# Patient Record
Sex: Female | Born: 1990 | Hispanic: No | Marital: Single | Smoking: Current every day smoker
Health system: Southern US, Community
[De-identification: ages and names within clinical notes are randomized; demographics above are authoritative.]

## PROBLEM LIST (undated history)

## (undated) ENCOUNTER — Emergency Department: Discharge status: Absent without leave | Payer: BLUE CROSS/BLUE SHIELD

## (undated) DIAGNOSIS — F319 Bipolar disorder, unspecified: Secondary | ICD-10-CM

## (undated) DIAGNOSIS — F429 Obsessive-compulsive disorder, unspecified: Secondary | ICD-10-CM

## (undated) DIAGNOSIS — F329 Major depressive disorder, single episode, unspecified: Secondary | ICD-10-CM

## (undated) DIAGNOSIS — F32A Depression, unspecified: Secondary | ICD-10-CM

## (undated) DIAGNOSIS — F419 Anxiety disorder, unspecified: Secondary | ICD-10-CM

## (undated) HISTORY — PX: SPINE SURGERY: SHX786

## (undated) HISTORY — PX: FRACTURE SURGERY: SHX138

---
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
## 2009-02-22 ENCOUNTER — Emergency Department: Payer: Self-pay | Admitting: Emergency Medicine

## 2009-02-25 ENCOUNTER — Emergency Department: Payer: Self-pay | Admitting: Internal Medicine

## 2011-11-08 ENCOUNTER — Emergency Department: Payer: Self-pay | Admitting: Emergency Medicine

## 2012-03-11 ENCOUNTER — Emergency Department: Payer: Self-pay | Admitting: Internal Medicine

## 2012-11-06 ENCOUNTER — Emergency Department: Payer: Self-pay | Admitting: Emergency Medicine

## 2012-11-10 ENCOUNTER — Emergency Department: Payer: Self-pay | Admitting: Emergency Medicine

## 2013-07-14 ENCOUNTER — Emergency Department: Payer: Self-pay | Admitting: Emergency Medicine

## 2013-07-14 LAB — URINALYSIS, COMPLETE
Bilirubin,UR: NEGATIVE
Blood: NEGATIVE
Glucose,UR: NEGATIVE mg/dL (ref 0–75)
Ketone: NEGATIVE
Nitrite: NEGATIVE
PH: 5 (ref 4.5–8.0)
PROTEIN: NEGATIVE
RBC,UR: 4 /HPF (ref 0–5)
Specific Gravity: 1.029 (ref 1.003–1.030)
Squamous Epithelial: 3

## 2013-07-14 LAB — CBC WITH DIFFERENTIAL/PLATELET
Basophil #: 0.1 10*3/uL (ref 0.0–0.1)
Basophil %: 0.7 %
EOS PCT: 2.3 %
Eosinophil #: 0.2 10*3/uL (ref 0.0–0.7)
HCT: 38.9 % (ref 35.0–47.0)
HGB: 13.3 g/dL (ref 12.0–16.0)
LYMPHS PCT: 40.2 %
Lymphocyte #: 3.4 10*3/uL (ref 1.0–3.6)
MCH: 32.1 pg (ref 26.0–34.0)
MCHC: 34.3 g/dL (ref 32.0–36.0)
MCV: 94 fL (ref 80–100)
MONOS PCT: 7.3 %
Monocyte #: 0.6 x10 3/mm (ref 0.2–0.9)
NEUTROS ABS: 4.2 10*3/uL (ref 1.4–6.5)
NEUTROS PCT: 49.5 %
Platelet: 290 10*3/uL (ref 150–440)
RBC: 4.15 10*6/uL (ref 3.80–5.20)
RDW: 12.9 % (ref 11.5–14.5)
WBC: 8.5 10*3/uL (ref 3.6–11.0)

## 2013-07-14 LAB — COMPREHENSIVE METABOLIC PANEL
ALBUMIN: 4.4 g/dL (ref 3.4–5.0)
AST: 29 U/L (ref 15–37)
Alkaline Phosphatase: 38 U/L — ABNORMAL LOW
Anion Gap: 5 — ABNORMAL LOW (ref 7–16)
BUN: 14 mg/dL (ref 7–18)
Bilirubin,Total: 0.4 mg/dL (ref 0.2–1.0)
Calcium, Total: 8.8 mg/dL (ref 8.5–10.1)
Chloride: 106 mmol/L (ref 98–107)
Co2: 26 mmol/L (ref 21–32)
Creatinine: 0.97 mg/dL (ref 0.60–1.30)
EGFR (Non-African Amer.): >60
Glucose: 80 mg/dL (ref 65–99)
Osmolality: 273 (ref 275–301)
POTASSIUM: 3.9 mmol/L (ref 3.5–5.1)
SGPT (ALT): 14 U/L (ref 12–78)
SODIUM: 137 mmol/L (ref 136–145)
TOTAL PROTEIN: 7.3 g/dL (ref 6.4–8.2)

## 2013-07-14 LAB — LIPASE, BLOOD: LIPASE: 180 U/L (ref 73–393)

## 2013-07-14 LAB — GC/CHLAMYDIA PROBE AMP

## 2013-07-14 LAB — WET PREP, GENITAL

## 2013-11-16 ENCOUNTER — Emergency Department: Payer: Self-pay | Admitting: Emergency Medicine

## 2014-06-27 ENCOUNTER — Emergency Department
Admission: EM | Admit: 2014-06-27 | Discharge: 2014-06-27 | Disposition: A | Discharge status: Home / Self Care | Payer: BLUE CROSS/BLUE SHIELD | Attending: Emergency Medicine | Admitting: Emergency Medicine

## 2014-06-27 ENCOUNTER — Encounter: Payer: Self-pay | Admitting: Emergency Medicine

## 2014-06-27 ENCOUNTER — Emergency Department: Payer: BLUE CROSS/BLUE SHIELD

## 2014-06-27 DIAGNOSIS — Z87891 Personal history of nicotine dependence: Secondary | ICD-10-CM | POA: Insufficient documentation

## 2014-06-27 DIAGNOSIS — Z3202 Encounter for pregnancy test, result negative: Secondary | ICD-10-CM | POA: Diagnosis not present

## 2014-06-27 DIAGNOSIS — R079 Chest pain, unspecified: Secondary | ICD-10-CM | POA: Diagnosis not present

## 2014-06-27 LAB — COMPREHENSIVE METABOLIC PANEL
ALT: 10 U/L — ABNORMAL LOW (ref 14–54)
ANION GAP: 6 (ref 5–15)
AST: 17 U/L (ref 15–41)
Albumin: 3.9 g/dL (ref 3.5–5.0)
Alkaline Phosphatase: 32 U/L — ABNORMAL LOW (ref 38–126)
BUN: 9 mg/dL (ref 6–20)
CHLORIDE: 106 mmol/L (ref 101–111)
CO2: 27 mmol/L (ref 22–32)
Calcium: 8.6 mg/dL — ABNORMAL LOW (ref 8.9–10.3)
Creatinine, Ser: 0.86 mg/dL (ref 0.44–1.00)
GFR calc Af Amer: >60 mL/min (ref >60)
Glucose, Bld: 93 mg/dL (ref 65–99)
Potassium: 3.7 mmol/L (ref 3.5–5.1)
Sodium: 139 mmol/L (ref 135–145)
TOTAL PROTEIN: 6.5 g/dL (ref 6.5–8.1)
Total Bilirubin: 0.3 mg/dL (ref 0.3–1.2)

## 2014-06-27 LAB — CBC
HEMATOCRIT: 40.3 % (ref 35.0–47.0)
HEMOGLOBIN: 13.5 g/dL (ref 12.0–16.0)
MCH: 31.4 pg (ref 26.0–34.0)
MCHC: 33.5 g/dL (ref 32.0–36.0)
MCV: 93.6 fL (ref 80.0–100.0)
Platelets: 305 10*3/uL (ref 150–440)
RBC: 4.31 MIL/uL (ref 3.80–5.20)
RDW: 13.2 % (ref 11.5–14.5)
WBC: 8.7 10*3/uL (ref 3.6–11.0)

## 2014-06-27 LAB — TROPONIN I

## 2014-06-27 LAB — POCT PREGNANCY, URINE: PREG TEST UR: NEGATIVE

## 2014-06-27 MED ORDER — IPRATROPIUM-ALBUTEROL 0.5-2.5 (3) MG/3ML IN SOLN
RESPIRATORY_TRACT | Status: AC
  Administered 2014-06-27: 3 mL via RESPIRATORY_TRACT
  Filled 2014-06-27: qty 3

## 2014-06-27 MED ORDER — ONDANSETRON HCL 4 MG/2ML IJ SOLN
INTRAMUSCULAR | Status: DC
Start: 2014-06-27 — End: 2014-06-27
  Filled 2014-06-27: qty 2

## 2014-06-27 MED ORDER — IPRATROPIUM-ALBUTEROL 0.5-2.5 (3) MG/3ML IN SOLN
3.0000 mL | Freq: Once | RESPIRATORY_TRACT | Status: AC
  Administered 2014-06-27: 3 mL via RESPIRATORY_TRACT

## 2014-06-27 MED ORDER — ALBUTEROL SULFATE HFA 108 (90 BASE) MCG/ACT IN AERS: 2.0000 | INHALATION_SPRAY | Freq: Four times a day (QID) | RESPIRATORY_TRACT | Status: DC | PRN

## 2014-06-27 NOTE — ED Notes (Signed)
Pt presents to ED with c/o midsternal chest pain that started about 2 days ago and has become increasing painful. Slight sob and fatigue; denies nausea.  Hx of the same but was never seen by pcp.

## 2014-06-27 NOTE — Discharge Instructions (Signed)

## 2014-06-27 NOTE — ED Provider Notes (Signed)
[facility] Emergency Department Provider Note  Time seen: 7:08 AM  I have reviewed the triage vital signs and the nursing notes.   HISTORY  Chief Complaint Chest Pain    HPI Kathy Henry is a 24 y.o. female with no past medical history presents the emergency department with chest tightness. According to the patient for the past few days she has been having intermittent chest tightness. She states these episodes appear to be increasing in frequency. Describes them as moderate. Has not noticed any modifying factors. Denies smoking cigarettes. Denies cough/congestion. States at times she has some mild midsternal chest pain in addition to her chest tightness. Denies any pain currently but does state some chest tightness.    History reviewed. No pertinent past medical history.  There are no active problems to display for this patient.   History reviewed. No pertinent past surgical history.  No current outpatient prescriptions on file.  Allergies Review of patient's allergies indicates no known allergies.  No family history on file.  Social History History  Substance Use Topics  . Smoking status: Former Games developer  . Smokeless tobacco: Not on file  . Alcohol Use: No    Review of Systems Constitutional: Negative for fever. Cardiovascular: Mild intermittent chest pain. Respiratory: Positive for intermittent shortness breath/chest tightness. Gastrointestinal: Negative for abdominal pain, vomiting and diarrhea. Genitourinary: Negative for dysuria. Musculoskeletal: Negative for back pain. 10-point ROS otherwise negative.  ____________________________________________   PHYSICAL EXAM:  VITAL SIGNS: ED Triage Vitals  Enc Vitals Group     BP 06/27/14 0540 121/84 mmHg     Pulse Rate 06/27/14 0540 76     Resp 06/27/14 0540 18     Temp 06/27/14 0540 97.9 F (36.6 C)     Temp src --      SpO2 06/27/14 0540 100 %     Weight 06/27/14 0540 128 lb  (58.06 kg)     Height 06/27/14 0540  (1.727 m)     Head Cir --      Peak Flow --      Pain Score 06/27/14 0541 8     Pain Loc --      Pain Edu? --      Excl. in GC? --     Constitutional: Alert and oriented. Well appearing and in no distress. ENT   Head: Normocephalic and atraumatic.Marland Kitchen   Mouth/Throat: Mucous membranes are moist. Cardiovascular: Normal rate, regular rhythm. No murmurs Respiratory: Normal respiratory effort without tachypnea nor retractions. Mild expiratory wheeze bilaterally, no rales or rhonchi. Gastrointestinal: Soft and nontender. No distention.   Musculoskeletal: Nontender with normal range of motion in all extremities. No lower extremity tenderness or edema. Neurologic:  Normal speech and language. No gross focal neurologic deficits  Skin:  Skin is warm, dry and intact.  Psychiatric: Mood and affect are normal. Speech and behavior are normal.  ____________________________________________    EKG  EKG reviewed and interpreted by myself shows sinus rhythm at 75 bpm, narrow QRS, normal axis, normal intervals, normal ST segments.  ____________________________________________    RADIOLOGY  Chest x-ray within normal limits  ____________________________________________    INITIAL IMPRESSION / ASSESSMENT AND PLAN / ED COURSE  Pertinent labs & imaging results that were available during my care of the patient were reviewed by me and considered in my medical decision making (see chart for details).  Patient with intermittent chest pain/tightness for the past several days. Patient has mild wheeze on exam. Denies smoking history. Denies  asthma. We will check a chest x-ray, give a breathing treatment, and reassess. Patient's labs and EKG are within normal limits. Patient agreeable to plan.   ----------------------------------------- 8:09 AM on 06/27/2014 -----------------------------------------  Chest x-ray within normal limits, patient improved  after albuterol. We'll discharge him with albuterol MDI and primary care follow-up a patient agreeable to plan. ____________________________________________   FINAL CLINICAL IMPRESSION(S) / ED DIAGNOSES  Chest pain   Minna AntisKevin Simcha Farrington, MD 06/27/14 [phone]

## 2014-06-30 ENCOUNTER — Emergency Department
Admission: EM | Admit: 2014-06-30 | Discharge: 2014-06-30 | Disposition: A | Discharge status: Home / Self Care | Payer: BLUE CROSS/BLUE SHIELD | Attending: Emergency Medicine | Admitting: Emergency Medicine

## 2014-06-30 DIAGNOSIS — Z87891 Personal history of nicotine dependence: Secondary | ICD-10-CM | POA: Diagnosis not present

## 2014-06-30 DIAGNOSIS — B309 Viral conjunctivitis, unspecified: Secondary | ICD-10-CM | POA: Insufficient documentation

## 2014-06-30 DIAGNOSIS — R112 Nausea with vomiting, unspecified: Secondary | ICD-10-CM | POA: Insufficient documentation

## 2014-06-30 LAB — URINALYSIS COMPLETE WITH MICROSCOPIC (ARMC ONLY)
BACTERIA UA: NONE SEEN
BILIRUBIN URINE: NEGATIVE
Glucose, UA: NEGATIVE mg/dL
Hgb urine dipstick: NEGATIVE
Ketones, ur: NEGATIVE mg/dL
Leukocytes, UA: NEGATIVE
Nitrite: NEGATIVE
Protein, ur: NEGATIVE mg/dL
Specific Gravity, Urine: 1.009 (ref 1.005–1.030)
pH: 6 (ref 5.0–8.0)

## 2014-06-30 LAB — COMPREHENSIVE METABOLIC PANEL
ALK PHOS: 37 U/L — AB (ref 38–126)
ALT: 14 U/L (ref 14–54)
AST: 24 U/L (ref 15–41)
Albumin: 4.3 g/dL (ref 3.5–5.0)
Anion gap: 8 (ref 5–15)
BUN: 7 mg/dL (ref 6–20)
CALCIUM: 9 mg/dL (ref 8.9–10.3)
CO2: 24 mmol/L (ref 22–32)
Chloride: 105 mmol/L (ref 101–111)
Creatinine, Ser: 0.75 mg/dL (ref 0.44–1.00)
GFR calc non Af Amer: >60 mL/min (ref >60)
Glucose, Bld: 92 mg/dL (ref 65–99)
Potassium: 4.7 mmol/L (ref 3.5–5.1)
Sodium: 137 mmol/L (ref 135–145)
TOTAL PROTEIN: 6.9 g/dL (ref 6.5–8.1)
Total Bilirubin: 0.4 mg/dL (ref 0.3–1.2)

## 2014-06-30 LAB — CBC WITH DIFFERENTIAL/PLATELET
BASOS PCT: 6 %
Basophils Absolute: 0.4 10*3/uL — ABNORMAL HIGH (ref 0–0.1)
Eosinophils Absolute: 0.2 10*3/uL (ref 0–0.7)
Eosinophils Relative: 3 %
HCT: 39.9 % (ref 35.0–47.0)
HEMOGLOBIN: 13.4 g/dL (ref 12.0–16.0)
LYMPHS ABS: 1.2 10*3/uL (ref 1.0–3.6)
Lymphocytes Relative: 19 %
MCH: 31.6 pg (ref 26.0–34.0)
MCHC: 33.6 g/dL (ref 32.0–36.0)
MCV: 94 fL (ref 80.0–100.0)
Monocytes Absolute: 0.3 10*3/uL (ref 0.2–0.9)
Monocytes Relative: 5 %
NEUTROS ABS: 4.4 10*3/uL (ref 1.4–6.5)
Neutrophils Relative %: 67 %
PLATELETS: 315 10*3/uL (ref 150–440)
RBC: 4.25 MIL/uL (ref 3.80–5.20)
RDW: 13.1 % (ref 11.5–14.5)
WBC: 6.6 10*3/uL (ref 3.6–11.0)

## 2014-06-30 LAB — GLUCOSE, CAPILLARY: Glucose-Capillary: 80 mg/dL (ref 65–99)

## 2014-06-30 LAB — LIPASE, BLOOD: Lipase: 43 U/L (ref 22–51)

## 2014-06-30 MED ORDER — SODIUM CHLORIDE 0.9 % IV SOLN
1000.0000 mL | Freq: Once | INTRAVENOUS | Status: AC
  Administered 2014-06-30: 1000 mL via INTRAVENOUS

## 2014-06-30 MED ORDER — ACETAMINOPHEN 500 MG PO TABS
ORAL_TABLET | ORAL | Status: DC
Start: 2014-06-30 — End: 2014-06-30
  Filled 2014-06-30: qty 2

## 2014-06-30 MED ORDER — ACETAMINOPHEN 500 MG PO TABS
1000.0000 mg | ORAL_TABLET | Freq: Once | ORAL | Status: AC
  Administered 2014-06-30: 1000 mg via ORAL

## 2014-06-30 MED ORDER — ONDANSETRON HCL 4 MG/2ML IJ SOLN
4.0000 mg | Freq: Once | INTRAMUSCULAR | Status: AC
  Administered 2014-06-30: 4 mg via INTRAVENOUS

## 2014-06-30 MED ORDER — ONDANSETRON HCL 4 MG/2ML IJ SOLN
INTRAMUSCULAR | Status: AC
  Administered 2014-06-30: 4 mg via INTRAVENOUS
  Filled 2014-06-30: qty 2

## 2014-06-30 MED ORDER — POLYMYXIN B-TRIMETHOPRIM 10000-0.1 UNIT/ML-% OP SOLN: OPHTHALMIC | Status: DC

## 2014-06-30 MED ORDER — ONDANSETRON HCL 4 MG PO TABS: ORAL_TABLET | ORAL | Status: DC

## 2014-06-30 NOTE — ED Provider Notes (Signed)
[facility] Emergency Department Provider Note  ____________________________________________  Time seen: Approximately 5:02 PM  I have reviewed the triage vital signs and the nursing notes.   HISTORY  Chief Complaint Vomiting    HPI Kathy Henry is a 24 y.o. female with no significant past medical history who presents with multiple episodes of vomiting that started last night.  She states the symptoms have been severe.  They came on acutely about 45 minutes after the patient ate dinner.  She states that she continued to vomit during the night.  She last vomited prior to her arrival in the emergency department.  She has felt better since receiving Zofran 4 mg IV and some IV fluids.  She denies any abdominal pain.  She was seen several days ago for chest tightness and treated for wheezing.  She says this has improved but she still has a mild cough.  She has a tobacco history but no significant past medical history otherwise.   No past medical history on file.  There are no active problems to display for this patient.   No past surgical history on file.  Current Outpatient Rx  Name  Route  Sig  Dispense  Refill  . albuterol (PROVENTIL HFA;VENTOLIN HFA) 108 (90 BASE) MCG/ACT inhaler   Inhalation   Inhale 2 puffs into the lungs every 6 (six) hours as needed for wheezing or shortness of breath.   1 Inhaler   2   . ondansetron (ZOFRAN) 4 MG tablet      Take 1-2 tabs by mouth every 8 hours as needed for nausea/vomiting   30 tablet   0   . trimethoprim-polymyxin b (POLYTRIM) ophthalmic solution      Place 2 drops in affected eye(s) every 4 hours for 7 days   10 mL   0     Allergies Review of patient's allergies indicates no known allergies.  No family history on file.  Social History History  Substance Use Topics  . Smoking status: Former Games developer  . Smokeless tobacco: Not on file  . Alcohol Use: No    Review of Systems Constitutional: No  fever/chills Eyes: No visual changes.  Her right eye is red and somewhat swollen. ENT: No sore throat. Cardiovascular: Denies chest pain. Respiratory: Denies shortness of breath.  Occasional cough Gastrointestinal: No abdominal pain.  Vomiting and nausea as described above. No diarrhea.  No constipation. Genitourinary: Negative for dysuria. Musculoskeletal: Negative for back pain. Skin: Negative for rash. Neurological: Negative for headaches, focal weakness or numbness.  10-point ROS otherwise negative.  ____________________________________________   PHYSICAL EXAM:  VITAL SIGNS: ED Triage Vitals  Enc Vitals Group     BP 06/30/14 1426 134/83 mmHg     Pulse Rate 06/30/14 1426 60     Resp 06/30/14 1426 15     Temp 06/30/14 1426 97.5 F (36.4 C)     Temp src --      SpO2 06/30/14 1426 100 %     Weight 06/30/14 1426 128 lb (58.06 kg)     Height 06/30/14 1426  (1.727 m)     Head Cir --      Peak Flow --      Pain Score --      Pain Loc --      Pain Edu? --      Excl. in GC? --     Constitutional: Alert and oriented.  The patient is somewhat pale but is in no acute  distress and is laughing and joking with me. Eyes: Conjunctival injection of the right eye with some mild swelling and serous discharge.  No purulence.  No chemosis.  No photophobia.  Left eye is unremarkable. PERRL. EOMI. Head: Atraumatic. Nose: No congestion/rhinnorhea. Mouth/Throat: Mucous membranes are moist.  Oropharynx non-erythematous. Neck: No stridor.   Cardiovascular: Normal rate, regular rhythm. Grossly normal heart sounds.  Good peripheral circulation. Respiratory: Normal respiratory effort.  No retractions. Lungs CTAB. Gastrointestinal: Soft and nontender. No distention. No abdominal bruits. No CVA tenderness. Musculoskeletal: No lower extremity tenderness nor edema.  No joint effusions. Neurologic:  Normal speech and language. No gross focal neurologic deficits are appreciated. Speech is normal.  No gait instability. Skin:  Skin is warm, dry and intact. No rash noted. Psychiatric: Mood and affect are normal. Speech and behavior are normal.  ____________________________________________   LABS (all labs ordered are listed, but only abnormal results are displayed)  Labs Reviewed  CBC WITH DIFFERENTIAL/PLATELET - Abnormal; Notable for the following:    Basophils Absolute 0.4 (*)    All other components within normal limits  COMPREHENSIVE METABOLIC PANEL - Abnormal; Notable for the following:    Alkaline Phosphatase 37 (*)    All other components within normal limits  URINALYSIS COMPLETEWITH MICROSCOPIC ([facility] ONLY) - Abnormal; Notable for the following:    Color, Urine YELLOW (*)    APPearance CLEAR (*)    Squamous Epithelial / LPF 0-5 (*)    All other components within normal limits  LIPASE, BLOOD  GLUCOSE, CAPILLARY  POC URINE PREG, ED   ____________________________________________  EKG  Not indicated ____________________________________________  RADIOLOGY  Not indicated ____________________________________________  INITIAL IMPRESSION / ASSESSMENT AND PLAN / ED COURSE  Pertinent labs & imaging results that were available during my care of the patient were reviewed by me and considered in my medical decision making (see chart for details).  The patient appears stabilized after 4 mg of Zofran and some normal saline.  She developed a mild headache just recently and is being treated with Tylenol.  We discussed the possibility of a viral infection, bad food exposure, or "other".  However the patient is stable with no significant lab abnormalities and absolutely no abdominal tenderness to palpation.  She feels better and tolerated a by mouth challenge in the emergency department.  I we will discharge her with Zofran, Polytrim drops for her conjunctivitis, and my usual and customary return precautions.  ____________________________________________  FINAL CLINICAL  IMPRESSION(S) / ED DIAGNOSES  Final diagnoses:  Non-intractable vomiting with nausea, vomiting of unspecified type  Viral conjunctivitis      NEW MEDICATIONS STARTED DURING THIS VISIT:  New Prescriptions   ONDANSETRON (ZOFRAN) 4 MG TABLET    Take 1-2 tabs by mouth every 8 hours as needed for nausea/vomiting   TRIMETHOPRIM-POLYMYXIN B (POLYTRIM) OPHTHALMIC SOLUTION    Place 2 drops in affected eye(s) every 4 hours for 7 days     Loleta Rose, MD 06/30/14 1756

## 2014-06-30 NOTE — ED Notes (Signed)
Patient c/o n/v since last night. Patient states she has not voided today. Patient appears pale in triage. Denies abdominal pain.

## 2014-06-30 NOTE — Discharge Instructions (Signed)
We believe your symptoms are caused by either a viral infection or possible a bad food exposure.  Either way, since your symptoms have improved, we feel it is safe for you to go home and follow up with your regular doctor.  Please read the included information and stick to a bland diet for the next two days.  Drink plenty of clear fluids, and if you were provided with a prescription, please take it according to the label instructions.    We also prescribed some eyedrops to try and keep your viral infection from turning into a bacterial infection.  The prescription is typically $4 at [facility], but the cost may vary at other pharmacies.  Please administer the eyedrops as prescribed.  If you develop any new or worsening symptoms, including persistent vomiting not controlled with medication, fever greater than 101, severe or worsening abdominal pain, or other symptoms that concern you, please return immediately to the Emergency Department.   Nausea and Vomiting Nausea means you feel sick to your stomach. Throwing up (vomiting) is a reflex where stomach contents come out of your mouth. HOME CARE   Take medicine as told by your doctor.  Do not force yourself to eat. However, you do need to drink fluids.  If you feel like eating, eat a normal diet as told by your doctor.  Eat rice, wheat, potatoes, bread, lean meats, yogurt, fruits, and vegetables.  Avoid high-fat foods.  Drink enough fluids to keep your pee (urine) clear or pale yellow.  Ask your doctor how to replace body fluid losses (rehydrate). Signs of body fluid loss (dehydration) include:  Feeling very thirsty.  Dry lips and mouth.  Feeling dizzy.  Dark pee.  Peeing less than normal.  Feeling confused.  Fast breathing or heart rate. GET HELP RIGHT AWAY IF:   You have blood in your throw up.  You have black or bloody poop (stool).  You have a bad headache or stiff neck.  You feel confused.  You have bad belly  (abdominal) pain.  You have chest pain or trouble breathing.  You do not pee at least once every 8 hours.  You have cold, clammy skin.  You keep throwing up after 24 to 48 hours.  You have a fever. MAKE SURE YOU:   Understand these instructions.  Will watch your condition.  Will get help right away if you are not doing well or get worse. Document Released: 06/25/2007 Document Revised: 03/31/2011 Document Reviewed: 06/07/2010 [facility] Patient Information 2015 [place], [place]. This information is not intended to replace advice given to you by your health care provider. Make sure you discuss any questions you have with your health care provider.  Conjunctivitis Conjunctivitis is commonly called "pink eye." Conjunctivitis can be caused by bacterial or viral infection, allergies, or injuries. There is usually redness of the lining of the eye, itching, discomfort, and sometimes discharge. There may be deposits of matter along the eyelids. A viral infection usually causes a watery discharge, while a bacterial infection causes a yellowish, thick discharge. Pink eye is very contagious and spreads by direct contact. You may be given antibiotic eyedrops as part of your treatment. Before using your eye medicine, remove all drainage from the eye by washing gently with warm water and cotton balls. Continue to use the medication until you have awakened 2 mornings in a row without discharge from the eye. Do not rub your eye. This increases the irritation and helps spread infection. Use separate towels from other  household members. Wash your hands with soap and water before and after touching your eyes. Use cold compresses to reduce pain and sunglasses to relieve irritation from light. Do not wear contact lenses or wear eye makeup until the infection is gone. SEEK MEDICAL CARE IF:   Your symptoms are not better after 3 days of treatment.  You have increased pain or trouble seeing.  The outer eyelids  become very red or swollen. Document Released: 02/14/2004 Document Revised: 03/31/2011 Document Reviewed: 01/06/2005 [facility] Patient Information 2015 [place], [place]. This information is not intended to replace advice given to you by your health care provider. Make sure you discuss any questions you have with your health care provider.

## 2014-10-19 ENCOUNTER — Emergency Department
Admission: EM | Admit: 2014-10-19 | Discharge: 2014-10-19 | Disposition: A | Discharge status: Home / Self Care | Payer: BLUE CROSS/BLUE SHIELD | Attending: Emergency Medicine | Admitting: Emergency Medicine

## 2014-10-19 ENCOUNTER — Emergency Department: Payer: BLUE CROSS/BLUE SHIELD

## 2014-10-19 ENCOUNTER — Encounter: Payer: Self-pay | Admitting: Emergency Medicine

## 2014-10-19 DIAGNOSIS — S60221A Contusion of right hand, initial encounter: Secondary | ICD-10-CM | POA: Insufficient documentation

## 2014-10-19 DIAGNOSIS — Z79899 Other long term (current) drug therapy: Secondary | ICD-10-CM | POA: Insufficient documentation

## 2014-10-19 DIAGNOSIS — Y9289 Other specified places as the place of occurrence of the external cause: Secondary | ICD-10-CM | POA: Insufficient documentation

## 2014-10-19 DIAGNOSIS — Y9389 Activity, other specified: Secondary | ICD-10-CM | POA: Insufficient documentation

## 2014-10-19 DIAGNOSIS — Z87891 Personal history of nicotine dependence: Secondary | ICD-10-CM | POA: Insufficient documentation

## 2014-10-19 DIAGNOSIS — W2209XA Striking against other stationary object, initial encounter: Secondary | ICD-10-CM | POA: Insufficient documentation

## 2014-10-19 DIAGNOSIS — Y998 Other external cause status: Secondary | ICD-10-CM | POA: Insufficient documentation

## 2014-10-19 MED ORDER — IBUPROFEN 800 MG PO TABS: 800.0000 mg | ORAL_TABLET | Freq: Three times a day (TID) | ORAL | Status: DC

## 2014-10-19 NOTE — ED Provider Notes (Signed)
[facility] Emergency Department Provider Note  ____________________________________________  Time seen: Approximately 2:28 PM  I have reviewed the triage vital signs and the nursing notes.   HISTORY  Chief Complaint Hand Pain   HPI Kathy Henry is a 24 y.o. female is here with complaint of right hand pain. She states that yesterday she had a wooden cabinet with her fist because she was mad. She denies any previous injury to her hand. She has not taken any over-the-counter medication or used ice to help with her hand pain. She rates her pain as 5 out of 10.   History reviewed. No pertinent past medical history.  There are no active problems to display for this patient.   No past surgical history on file.  Current Outpatient Rx  Name  Route  Sig  Dispense  Refill  . albuterol (PROVENTIL HFA;VENTOLIN HFA) 108 (90 BASE) MCG/ACT inhaler   Inhalation   Inhale 2 puffs into the lungs every 6 (six) hours as needed for wheezing or shortness of breath.   1 Inhaler   2   . ondansetron (ZOFRAN) 4 MG tablet      Take 1-2 tabs by mouth every 8 hours as needed for nausea/vomiting   30 tablet   0   . trimethoprim-polymyxin b (POLYTRIM) ophthalmic solution      Place 2 drops in affected eye(s) every 4 hours for 7 days   10 mL   0     Allergies Review of patient's allergies indicates no known allergies.  No family history on file.  Social History Social History  Substance Use Topics  . Smoking status: Former Games developer  . Smokeless tobacco: None  . Alcohol Use: No    Review of Systems Constitutional: No fever/chills Eyes: No visual changes. Cardiovascular: Denies chest pain. Respiratory: Denies shortness of breath. Gastrointestinal:  No nausea, no vomiting.   Genitourinary: Negative for dysuria. Musculoskeletal: Negative for back pain. Positive right hand pain Skin: Negative for rash. Neurological: Negative for headaches, focal weakness or  numbness.  10-point ROS otherwise negative.  ____________________________________________   PHYSICAL EXAM:  VITAL SIGNS: ED Triage Vitals  Enc Vitals Group     BP 10/19/14 1354 133/75 mmHg     Pulse Rate 10/19/14 1354 81     Resp 10/19/14 1354 18     Temp 10/19/14 1354 97.9 F (36.6 C)     Temp Source 10/19/14 1354 Oral     SpO2 10/19/14 1354 99 %     Weight --      Height --      Head Cir --      Peak Flow --      Pain Score 10/19/14 1343 5     Pain Loc --      Pain Edu? --      Excl. in GC? --     Constitutional: Alert and oriented. Well appearing and in no acute distress. Eyes: Conjunctivae are normal. PERRL. EOMI. Head: Atraumatic. Nose: No congestion/rhinnorhea. Neck: No stridor.   Cardiovascular: Normal rate, regular rhythm. Grossly normal heart sounds.  Good peripheral circulation. Respiratory: Normal respiratory effort.  No retractions. Lungs CTAB. Gastrointestinal: Soft and nontender. No distention Musculoskeletal: Right hand with moderate tenderness to palpation of the third fourth and fifth distal metacarpals. No gross deformity was noted. Skin is intact. Digits move with out any restriction. No lower extremity tenderness nor edema.  No joint effusions. Neurologic:  Normal speech and language. No gross focal neurologic deficits  are appreciated. No gait instability. Skin:  Skin is warm, dry and intact. No rash noted. No ecchymosis or abrasions noted. Psychiatric: Mood and affect are normal. Speech and behavior are normal.  ____________________________________________   LABS (all labs ordered are listed, but only abnormal results are displayed)  Labs Reviewed - No data to display RADIOLOGY  No fracture right hand. I, Tommi Rumps, personally viewed and evaluated these images (plain radiographs) as part of my medical decision making.  ____________________________________________   PROCEDURES  Procedure(s) performed: None  Critical Care performed:  No  ____________________________________________   INITIAL IMPRESSION / ASSESSMENT AND PLAN / ED COURSE  Pertinent labs & imaging results that were available during my care of the patient were reviewed by me and considered in my medical decision making (see chart for details)  Patient is to ice and elevate hand. Prescription for ibuprofen was written as stated for pain.   ____________________________________________   FINAL CLINICAL IMPRESSION(S) / ED DIAGNOSES  Final diagnoses:  Contusion of right hand, initial encounter      Tommi Rumps, PA-C 10/19/14 1520  Myrna Blazer, MD 10/19/14 [phone]

## 2014-10-19 NOTE — Discharge Instructions (Signed)
Contusion °A contusion is a deep bruise. Contusions happen when an injury causes bleeding under the skin. Signs of bruising include pain, puffiness (swelling), and discolored skin. The contusion may turn blue, purple, or yellow. °HOME CARE  °· Put ice on the injured area. °· Put ice in a plastic bag. °· Place a towel between your skin and the bag. °· Leave the ice on for 15-20 minutes, 03-04 times a day. °· Only take medicine as told by your doctor. °· Rest the injured area. °· If possible, raise (elevate) the injured area to lessen puffiness. °GET HELP RIGHT AWAY IF:  °· You have more bruising or puffiness. °· You have pain that is getting worse. °· Your puffiness or pain is not helped by medicine. °MAKE SURE YOU:  °· Understand these instructions. °· Will watch your condition. °· Will get help right away if you are not doing well or get worse. °Document Released: 06/25/2007 Document Revised: 03/31/2011 Document Reviewed: 11/11/2010 °[employer]® Patient Information ©2015 [employer]. This information is not intended to replace advice given to you by your health care provider. Make sure you discuss any questions you have with your health care provider. ° °Cryotherapy °Cryotherapy is when you put ice on your injury. Ice helps lessen pain and puffiness (swelling) after an injury. Ice works the best when you start using it in the first 24 to 48 hours after an injury. °HOME CARE °· Put a dry or damp towel between the ice pack and your skin. °· You may press gently on the ice pack. °· Leave the ice on for no more than 10 to 20 minutes at a time. °· Check your skin after 5 minutes to make sure your skin is okay. °· Rest at least 20 minutes between ice pack uses. °· Stop using ice when your skin loses feeling (numbness). °· Do not use ice on someone who cannot tell you when it hurts. This includes small children and people with memory problems (dementia). °GET HELP RIGHT AWAY IF: °· You have white spots on your  skin. °· Your skin turns blue or pale. °· Your skin feels waxy or hard. °· Your puffiness gets worse. °MAKE SURE YOU:  °· Understand these instructions. °· Will watch your condition. °· Will get help right away if you are not doing well or get worse. °Document Released: 06/25/2007 Document Revised: 03/31/2011 Document Reviewed: 08/29/2010 °[employer]® Patient Information ©2015 [employer]. This information is not intended to replace advice given to you by your health care provider. Make sure you discuss any questions you have with your health care provider. ° °

## 2014-10-19 NOTE — ED Notes (Signed)
Pt to ed with c/o right hand pain since yesterday after hitting a wooden cabinet.  Pt with mild swelling reports pain worse today,  Increased pain with movement of hand and fingers.

## 2014-10-19 NOTE — ED Notes (Signed)
Hand pain and swelling ..unknown injury

## 2014-10-24 ENCOUNTER — Emergency Department
Admission: EM | Admit: 2014-10-24 | Discharge: 2014-10-24 | Disposition: A | Discharge status: Home / Self Care | Payer: BLUE CROSS/BLUE SHIELD | Attending: Emergency Medicine | Admitting: Emergency Medicine

## 2014-10-24 DIAGNOSIS — Z87891 Personal history of nicotine dependence: Secondary | ICD-10-CM | POA: Insufficient documentation

## 2014-10-24 DIAGNOSIS — R112 Nausea with vomiting, unspecified: Secondary | ICD-10-CM | POA: Insufficient documentation

## 2014-10-24 DIAGNOSIS — R519 Headache, unspecified: Secondary | ICD-10-CM

## 2014-10-24 DIAGNOSIS — R51 Headache: Secondary | ICD-10-CM | POA: Insufficient documentation

## 2014-10-24 MED ORDER — KETOROLAC TROMETHAMINE 60 MG/2ML IM SOLN
INTRAMUSCULAR | Status: AC
  Administered 2014-10-24: 60 mg via INTRAMUSCULAR
  Filled 2014-10-24: qty 2

## 2014-10-24 MED ORDER — PROMETHAZINE HCL 25 MG/ML IJ SOLN
INTRAMUSCULAR | Status: AC
  Administered 2014-10-24: 50 mg via INTRAMUSCULAR
  Filled 2014-10-24: qty 1

## 2014-10-24 MED ORDER — IBUPROFEN 800 MG PO TABS: 800.0000 mg | ORAL_TABLET | Freq: Three times a day (TID) | ORAL | Status: DC | PRN

## 2014-10-24 MED ORDER — PROMETHAZINE HCL 25 MG/ML IJ SOLN
50.0000 mg | Freq: Once | INTRAMUSCULAR | Status: AC
  Administered 2014-10-24: 50 mg via INTRAMUSCULAR
  Filled 2014-10-24 (×2): qty 2

## 2014-10-24 MED ORDER — KETOROLAC TROMETHAMINE 60 MG/2ML IM SOLN
60.0000 mg | Freq: Once | INTRAMUSCULAR | Status: AC
  Administered 2014-10-24: 60 mg via INTRAMUSCULAR

## 2014-10-24 NOTE — ED Notes (Signed)
MD at bedside. 

## 2014-10-24 NOTE — ED Notes (Signed)
Pt c/o waking with a migraine this morning with a hx of migraine..states she is having nausea with it..states she has not taken anything for the pain

## 2014-10-24 NOTE — Discharge Instructions (Signed)

## 2014-10-24 NOTE — ED Provider Notes (Signed)
[facility] Emergency Department Provider Note     Time seen: ----------------------------------------- 3:22 PM on 10/24/2014 -----------------------------------------    I have reviewed the triage vital signs and the nursing notes.   HISTORY  Chief Complaint Migraine    HPI Kathy Henry is a 24 y.o. female who presents ER with migraine started this morning. Patient states he woke up with left-sided headache, had associated nausea and vomiting. Patient states she normally has migraines but they're on the right side of her head. She normally has visual disturbance but did not have one today. Patient states she slept normally last night, she went to an interview today despite her headache.   No past medical history on file.  There are no active problems to display for this patient.   No past surgical history on file.  Allergies Review of patient's allergies indicates no known allergies.  Social History Social History  Substance Use Topics  . Smoking status: Former Games developer  . Smokeless tobacco: Not on file  . Alcohol Use: No    Review of Systems Constitutional: Negative for fever. Eyes: Negative for visual changes. ENT: Negative for sore throat. Cardiovascular: Negative for chest pain. Respiratory: Negative for shortness of breath. Gastrointestinal: Negative for abdominal pain, positive for vomiting Genitourinary: Negative for dysuria. Musculoskeletal: Negative for back pain. Skin: Negative for rash. Neurological: Positive for headache, negative for weakness  10-point ROS otherwise negative.  ____________________________________________   PHYSICAL EXAM:  VITAL SIGNS: ED Triage Vitals  Enc Vitals Group     BP --      Pulse --      Resp --      Temp --      Temp src --      SpO2 --      Weight --      Height --      Head Cir --      Peak Flow --      Pain Score 10/24/14 1520 8     Pain Loc --      Pain Edu? --      Excl.  in GC? --     Constitutional: Alert and oriented. Well appearing and in no distress. Eyes: Conjunctivae are normal. PERRL. Normal extraocular movements. Negative for photophobia ENT   Head: Normocephalic and atraumatic.   Nose: No congestion/rhinnorhea.   Mouth/Throat: Mucous membranes are moist.   Neck: No stridor. No meningeal signs Cardiovascular: Normal rate, regular rhythm. Normal and symmetric distal pulses are present in all extremities. No murmurs, rubs, or gallops. Respiratory: Normal respiratory effort without tachypnea nor retractions. Breath sounds are clear and equal bilaterally. No wheezes/rales/rhonchi. Gastrointestinal: Soft and nontender. No distention. No abdominal bruits.  Musculoskeletal: Nontender with normal range of motion in all extremities. No joint effusions.  No lower extremity tenderness nor edema. Neurologic:  Normal speech and language. No gross focal neurologic deficits are appreciated. Speech is normal. No gait instability. Skin:  Skin is warm, dry and intact. No rash noted. Psychiatric: Mood and affect are normal. Speech and behavior are normal. Patient exhibits appropriate insight and judgment.  ____________________________________________  ED COURSE:  Pertinent labs & imaging results that were available during my care of the patient were reviewed by me and considered in my medical decision making (see chart for details). Patient will receive IM Phenergan and Toradol.  ____________________________________________  FINAL ASSESSMENT AND PLAN  Headache  Plan: Patient with labs and imaging as dictated above. Unclear cause for her headache. Questionable  tension versus atypical migraine. She is stable for outpatient follow-up.   Emily Filbert, MD   Emily Filbert, MD 10/24/14 [id number]

## 2014-11-13 ENCOUNTER — Encounter (HOSPITAL_COMMUNITY): Payer: Self-pay | Admitting: *Deleted

## 2014-11-13 ENCOUNTER — Emergency Department (HOSPITAL_COMMUNITY)
Admission: EM | Admit: 2014-11-13 | Discharge: 2014-11-13 | Disposition: A | Discharge status: Home / Self Care | Payer: BLUE CROSS/BLUE SHIELD | Attending: Emergency Medicine | Admitting: Emergency Medicine

## 2014-11-13 DIAGNOSIS — Z87891 Personal history of nicotine dependence: Secondary | ICD-10-CM | POA: Insufficient documentation

## 2014-11-13 DIAGNOSIS — Z79899 Other long term (current) drug therapy: Secondary | ICD-10-CM | POA: Insufficient documentation

## 2014-11-13 DIAGNOSIS — M79605 Pain in left leg: Secondary | ICD-10-CM

## 2014-11-13 DIAGNOSIS — Z791 Long term (current) use of non-steroidal anti-inflammatories (NSAID): Secondary | ICD-10-CM | POA: Insufficient documentation

## 2014-11-13 MED ORDER — IBUPROFEN 800 MG PO TABS
800.0000 mg | ORAL_TABLET | Freq: Once | ORAL | Status: DC
  Filled 2014-11-13: qty 1

## 2014-11-13 MED ORDER — TRAMADOL HCL 50 MG PO TABS
50.0000 mg | ORAL_TABLET | Freq: Once | ORAL | Status: AC
  Administered 2014-11-13: 50 mg via ORAL

## 2014-11-13 MED ORDER — TRAMADOL HCL 50 MG PO TABS
ORAL_TABLET | ORAL | Status: AC
  Filled 2014-11-13: qty 1

## 2014-11-13 NOTE — Discharge Instructions (Signed)
Paresthesia Kathy Henry, you likely had some injury to the nerve in your leg. This can take several weeks to heal on its own. Take Tylenol or ibuprofen as needed for the pain. See a primary care physician within 3 days for close follow-up. If any symptoms worsen or if yuo have concern for infection come back to the emergency department immediately. Thank you. Paresthesia is a burning or prickling feeling. This feeling can happen in any part of the body. It often happens in the hands, arms, legs, or feet. Usually, it is not painful. In most cases, the feeling goes away in a short time and is not a sign of a serious problem. HOME CARE  Avoid drinking alcohol.  Try massage or needle therapy (acupuncture) to help with your problems.  Keep all follow-up visits as told by your doctor. This is important. GET HELP IF:  You keep on having episodes of paresthesia.  Your burning or prickling feeling gets worse when you walk.  You have pain or cramps.  You feel dizzy.  You have a rash. GET HELP RIGHT AWAY IF:  You feel weak.  You have trouble walking or moving.  You have problems speaking, understanding, or seeing.  You feel confused.  You cannot control when you pee (urinate) or poop (bowel movement).  You lose feeling (numbness) after an injury.  You pass out (faint).   This information is not intended to replace advice given to you by your health care provider. Make sure you discuss any questions you have with your health care provider.   Document Released: 12/20/2007 Document Revised: 05/23/2014 Document Reviewed: 01/02/2014 Elsevier Interactive Patient Education [employer] Elsevier Inc.

## 2014-11-13 NOTE — ED Provider Notes (Signed)
CSN: [id number]     Arrival date & time 11/13/14  0145 History  By signing my name below, I, Freida Busmaniana Omoyeni, attest that this documentation has been prepared under the direction and in the presence of Tomasita CrumbleAdeleke Ouita Nish, MD . Electronically Signed: Freida Busmaniana Omoyeni, Scribe. 11/13/2014. 2:41 AM.    Chief Complaint  Patient presents with  . Leg Pain   The history is provided by the patient. No language interpreter was used.    HPI Comments:  Kathy Henry is a 24 y.o. female who presents to the Emergency Department complaining of burning pain to her posterior left leg with associated numbness at the site. Pt states her symptoms started after receiving visit to [facility] ED for migraine HA for which she was given a shot  in her left buttock; she denies HA at this time. Pt also notes intermittent aching pain in the leg. No alleviating factors noted.  History reviewed. No pertinent past medical history. History reviewed. No pertinent past surgical history. No family history on file. Social History  Substance Use Topics  . Smoking status: Former Games developermoker  . Smokeless tobacco: None  . Alcohol Use: No   OB History    No data available     Review of Systems  10 systems reviewed and all are negative for acute change except as noted in the HPI.    Allergies  Review of patient's allergies indicates no known allergies.  Home Medications   Prior to Admission medications   Medication Sig Start Date End Date Taking? Authorizing Provider  albuterol (PROVENTIL HFA;VENTOLIN HFA) 108 (90 BASE) MCG/ACT inhaler Inhale 2 puffs into the lungs every 6 (six) hours as needed for wheezing or shortness of breath. 06/27/14   Minna AntisKevin Paduchowski, MD  ibuprofen (ADVIL,MOTRIN) 800 MG tablet Take 1 tablet (800 mg total) by mouth 3 (three) times daily. 10/19/14   Tommi Rumpshonda L Summers, PA-C  ibuprofen (ADVIL,MOTRIN) 800 MG tablet Take 1 tablet (800 mg total) by mouth every 8 (eight) hours as needed. 10/24/14   Emily FilbertJonathan E Williams,  MD  ondansetron [facility](ZOFRAN) 4 MG tablet Take 1-2 tabs by mouth every 8 hours as needed for nausea/vomiting 06/30/14   Loleta Roseory Forbach, MD  trimethoprim-polymyxin b [facility](POLYTRIM) ophthalmic solution Place 2 drops in affected eye(s) every 4 hours for 7 days 06/30/14   Loleta Roseory Forbach, MD   BP 133/79 mmHg  Pulse 99  Temp(Src) 97.9 F (36.6 C) (Oral)  Resp 16  SpO2 99%  LMP 09/20/2014 (Approximate) Physical Exam  Constitutional: She is oriented to person, place, and time. She appears well-developed and well-nourished. No distress.  HENT:  Head: Normocephalic and atraumatic.  Nose: Nose normal.  Mouth/Throat: Oropharynx is clear and moist. No oropharyngeal exudate.  Eyes: Conjunctivae and EOM are normal. Pupils are equal, round, and reactive to light. No scleral icterus.  Neck: Normal range of motion. Neck supple. No JVD present. No tracheal deviation present. No thyromegaly present.  Cardiovascular: Normal rate, regular rhythm and normal heart sounds.  Exam reveals no gallop and no friction rub.   No murmur heard. Pulmonary/Chest: Effort normal and breath sounds normal. No respiratory distress. She has no wheezes. She exhibits no tenderness.  Abdominal: Soft. Bowel sounds are normal. She exhibits no distension and no mass. There is no tenderness. There is no rebound and no guarding.  Musculoskeletal: Normal range of motion. She exhibits tenderness. She exhibits no edema.  generalized TTP of the left posterior thigh No swelling redness or warmth  Lymphadenopathy:  She has no cervical adenopathy.  Neurological: She is alert and oriented to person, place, and time. No cranial nerve deficit. She exhibits normal muscle tone.  Skin: Skin is warm and dry. No rash noted. No erythema. No pallor.  Nursing note and vitals reviewed.   ED Course  Procedures   DIAGNOSTIC STUDIES:  Oxygen Saturation is 99% on RA, normal by my interpretation.    COORDINATION OF CARE:  2:19 AM Discussed treatment plan with  pt at bedside and pt agreed to plan.  Labs Review Labs Reviewed - No data to display  Imaging Review No results found.   EKG Interpretation None      MDM   Final diagnoses:  None   Patient presents emergency department for leg pain after getting injections at [facility] for headache. Looking at the prior notes, patient received Phenergan and Toradol. Patient is now having numbness and pain in her left lower extremity. She denies any urinary symptoms or weakness. This is likely due to nerve damage. Education was provided.  Physical exam is normal, she was given motrin and instructed to follow up with PCP.  She appears well and in AND.  VS remain within her normal limits and she is safe for DC   I, Zahara Rembert, personally performed the services described in this documentation. All medical record entries made by the scribe were at my direction and in my presence.  I have reviewed the chart and discharge instructions and agree that the record reflects my personal performance and is accurate and complete. Mitra Duling.  11/13/2014. 2:50 AM.      Tomasita Crumble, MD 11/13/14 [phone]

## 2014-11-13 NOTE — ED Notes (Signed)
The pt has had pain and numbness in her rt leg since she had an im injection  In that side of her buttocks at [place] ed for a headache

## 2015-01-30 ENCOUNTER — Emergency Department
Admission: EM | Admit: 2015-01-30 | Discharge: 2015-01-30 | Disposition: A | Discharge status: Home / Self Care | Payer: BLUE CROSS/BLUE SHIELD | Attending: Emergency Medicine | Admitting: Emergency Medicine

## 2015-01-30 DIAGNOSIS — R42 Dizziness and giddiness: Secondary | ICD-10-CM | POA: Insufficient documentation

## 2015-01-30 DIAGNOSIS — H6983 Other specified disorders of Eustachian tube, bilateral: Secondary | ICD-10-CM

## 2015-01-30 DIAGNOSIS — H6122 Impacted cerumen, left ear: Secondary | ICD-10-CM | POA: Insufficient documentation

## 2015-01-30 DIAGNOSIS — J3489 Other specified disorders of nose and nasal sinuses: Secondary | ICD-10-CM | POA: Insufficient documentation

## 2015-01-30 DIAGNOSIS — Z87891 Personal history of nicotine dependence: Secondary | ICD-10-CM | POA: Insufficient documentation

## 2015-01-30 DIAGNOSIS — H6993 Unspecified Eustachian tube disorder, bilateral: Secondary | ICD-10-CM | POA: Insufficient documentation

## 2015-01-30 MED ORDER — MECLIZINE HCL 50 MG PO TABS: 50.0000 mg | ORAL_TABLET | Freq: Three times a day (TID) | ORAL | Status: DC | PRN

## 2015-01-30 MED ORDER — MECLIZINE HCL 25 MG PO TABS
50.0000 mg | ORAL_TABLET | Freq: Once | ORAL | Status: AC
  Administered 2015-01-30: 50 mg via ORAL
  Filled 2015-01-30: qty 2

## 2015-01-30 MED ORDER — FEXOFENADINE-PSEUDOEPHED ER 60-120 MG PO TB12: 1.0000 | ORAL_TABLET | Freq: Two times a day (BID) | ORAL | Status: DC

## 2015-01-30 NOTE — ED Provider Notes (Addendum)
[facility] Emergency Department Provider Note  ____________________________________________  Time seen: Approximately 11:42 AM  I have reviewed the triage vital signs and the nursing notes.   HISTORY  Chief Complaint Otalgia    HPI Kathy Henry is a 25 y.o. female patient complaining of bilateral ear pain for 3 days. Patient state she is having some mild vertigo today. Patient also states she's having hearing loss. Patient denies any vision disturbance.Patient states some moderate nasal congestion. No palliative measures taken for these complaints. Patient rates her pain discomfort as a 10 over 10.   History reviewed. No pertinent past medical history.  There are no active problems to display for this patient.   Past Surgical History  Procedure Laterality Date  . Fracture surgery      Current Outpatient Rx  Name  Route  Sig  Dispense  Refill  . albuterol (PROVENTIL HFA;VENTOLIN HFA) 108 (90 BASE) MCG/ACT inhaler   Inhalation   Inhale 2 puffs into the lungs every 6 (six) hours as needed for wheezing or shortness of breath.   1 Inhaler   2   . fexofenadine-pseudoephedrine (ALLEGRA-D) 60-120 MG 12 hr tablet   Oral   Take 1 tablet by mouth 2 (two) times daily.   30 tablet   0   . ibuprofen (ADVIL,MOTRIN) 800 MG tablet   Oral   Take 1 tablet (800 mg total) by mouth 3 (three) times daily. Patient not taking: Reported on 11/13/2014   30 tablet   0   . ibuprofen (ADVIL,MOTRIN) 800 MG tablet   Oral   Take 1 tablet (800 mg total) by mouth every 8 (eight) hours as needed. Patient not taking: Reported on 11/13/2014   30 tablet   0   . meclizine (ANTIVERT) 50 MG tablet   Oral   Take 1 tablet (50 mg total) by mouth 3 (three) times daily as needed.   30 tablet   0   . ondansetron (ZOFRAN) 4 MG tablet      Take 1-2 tabs by mouth every 8 hours as needed for nausea/vomiting Patient not taking: Reported on 11/13/2014   30 tablet   0   .  trimethoprim-polymyxin b (POLYTRIM) ophthalmic solution      Place 2 drops in affected eye(s) every 4 hours for 7 days Patient not taking: Reported on 11/13/2014   10 mL   0     Allergies Review of patient's allergies indicates no known allergies.  No family history on file.  Social History Social History  Substance Use Topics  . Smoking status: Former Games developermoker  . Smokeless tobacco: None  . Alcohol Use: No    Review of Systems Constitutional: No fever/chills Eyes: No visual changes. ENT: No sore throat. Decreased hearing bilaterally and pain. Nasal congestion Cardiovascular: Denies chest pain. Respiratory: Denies shortness of breath. Gastrointestinal: No abdominal pain.  No nausea, no vomiting.  No diarrhea.  No constipation. Genitourinary: Negative for dysuria. Musculoskeletal: Negative for back pain. Skin: Negative for rash. Neurological: Negative for headaches, focal weakness or numbness. States mild vertigo 10-point ROS otherwise negative.  ____________________________________________   PHYSICAL EXAM:  VITAL SIGNS: ED Triage Vitals  Enc Vitals Group     BP 01/30/15 1109 114/86 mmHg     Pulse Rate 01/30/15 1109 69     Resp 01/30/15 1109 16     Temp 01/30/15 1109 97.9 F (36.6 C)     Temp Source 01/30/15 1109 Oral     SpO2 01/30/15 1109  100 %     Weight 01/30/15 1109 128 lb (58.06 kg)     Height 01/30/15 1109 5\' 8"  (1.727 m)     Head Cir --      Peak Flow --      Pain Score 01/30/15 1110 10     Pain Loc --      Pain Edu? --      Excl. in GC? --     Constitutional: Alert and oriented. Well appearing and in no acute distress. Eyes: Conjunctivae are normal. PERRL. EOMI. Head: Atraumatic. Nose: Bilateral maxillary guarding with edematous nasal turbinates. Clear rhinorrhea EARS;  left canal full of wax. Bulging right TM. Mouth/Throat: Mucous membranes are moist.  Oropharynx non-erythematous. Neck: No stridor.  No cervical spine tenderness to  palpation. Hematological/Lymphatic/Immunilogical: No cervical lymphadenopathy. Cardiovascular: Normal rate, regular rhythm. Grossly normal heart sounds.  Good peripheral circulation. Respiratory: Normal respiratory effort.  No retractions. Lungs CTAB. Gastrointestinal: Soft and nontender. No distention. No abdominal bruits. No CVA tenderness. Musculoskeletal: No lower extremity tenderness nor edema.  No joint effusions. Neurologic:  Normal speech and language. No gross focal neurologic deficits are appreciated. No gait instability. Skin:  Skin is warm, dry and intact. No rash noted. Psychiatric: Mood and affect are normal. Speech and behavior are normal.  ____________________________________________   LABS (all labs ordered are listed, but only abnormal results are displayed)  Labs Reviewed - No data to display ____________________________________________  EKG   ____________________________________________  RADIOLOGY   ____________________________________________   PROCEDURES: see procedure note  Procedure(s) performed: Left ear irrigation. Critical Care performed:   . Ceruminosis is noted.  Wax is removed by syringing with mixture of hydrogen peroxide/warm water and manual debridement. TM visible post procedure. Patient tolerated procedure well .____________________________________________   INITIAL IMPRESSION / ASSESSMENT AND PLAN / ED COURSE  Pertinent labs & imaging results that were available during my care of the patient were reviewed by me and considered in my medical decision making (see chart for details).  Cerumen impaction left ear. Eustachian tube dysfunction. Vertigo. Patient left it was irrigated to remove cerumen. Patient given prescription for Sudafed and Antivert. Patient [facility].  ____________________________________________   FINAL CLINICAL IMPRESSION(S) / ED DIAGNOSES  Final diagnoses:   Cerumen impaction, left  Eustachian tube dysfunction, bilateral  Vertigo      Joni Reining, PA-C 01/30/15 1151  Jeanmarie Plant, MD 01/30/15 1409  Joni Reining, PA-C 02/15/15 2329  Joni Reining, PA-C 02/15/15 2331  Jeanmarie Plant, MD 02/16/15 1731  Joni Reining, PA-C 02/20/15 1506  Joni Reining, PA-C 02/20/15 1506  Joni Reining, PA-C 02/20/15 1506  Jeanmarie Plant, MD 02/20/15 2121

## 2015-01-30 NOTE — ED Notes (Signed)
Pt c/o BL ear pain for the past 3 days, states she is having some dizziness

## 2015-01-30 NOTE — ED Notes (Signed)
Bilateral ear pain with some dizziness today   Ear pain started about 3 days ago

## 2015-02-27 ENCOUNTER — Emergency Department: Payer: BLUE CROSS/BLUE SHIELD

## 2015-02-27 ENCOUNTER — Emergency Department
Admission: EM | Admit: 2015-02-27 | Discharge: 2015-02-27 | Disposition: A | Discharge status: Home / Self Care | Payer: BLUE CROSS/BLUE SHIELD | Attending: Emergency Medicine | Admitting: Emergency Medicine

## 2015-02-27 ENCOUNTER — Encounter: Payer: Self-pay | Admitting: Emergency Medicine

## 2015-02-27 DIAGNOSIS — R0789 Other chest pain: Secondary | ICD-10-CM | POA: Insufficient documentation

## 2015-02-27 DIAGNOSIS — Z87891 Personal history of nicotine dependence: Secondary | ICD-10-CM | POA: Insufficient documentation

## 2015-02-27 DIAGNOSIS — Z79899 Other long term (current) drug therapy: Secondary | ICD-10-CM | POA: Insufficient documentation

## 2015-02-27 LAB — CBC
HCT: 36.1 % (ref 35.0–47.0)
HEMOGLOBIN: 12.3 g/dL (ref 12.0–16.0)
MCH: 31.5 pg (ref 26.0–34.0)
MCHC: 34.1 g/dL (ref 32.0–36.0)
MCV: 92.3 fL (ref 80.0–100.0)
PLATELETS: 252 10*3/uL (ref 150–440)
RBC: 3.91 MIL/uL (ref 3.80–5.20)
RDW: 12.9 % (ref 11.5–14.5)
WBC: 8.4 10*3/uL (ref 3.6–11.0)

## 2015-02-27 LAB — BASIC METABOLIC PANEL
ANION GAP: 3 — AB (ref 5–15)
BUN: 8 mg/dL (ref 6–20)
CALCIUM: 9 mg/dL (ref 8.9–10.3)
CO2: 29 mmol/L (ref 22–32)
Chloride: 107 mmol/L (ref 101–111)
Creatinine, Ser: 0.68 mg/dL (ref 0.44–1.00)
GFR calc Af Amer: >60 mL/min (ref >60)
GLUCOSE: 86 mg/dL (ref 65–99)
Potassium: 3.7 mmol/L (ref 3.5–5.1)
Sodium: 139 mmol/L (ref 135–145)

## 2015-02-27 LAB — TROPONIN I

## 2015-02-27 MED ORDER — ETODOLAC 400 MG PO TABS: 400.0000 mg | ORAL_TABLET | Freq: Two times a day (BID) | ORAL | Status: DC

## 2015-02-27 NOTE — Discharge Instructions (Signed)
Chest Wall Pain Chest wall pain is pain in or around the bones and muscles of your chest. Sometimes, an injury causes this pain. Sometimes, the cause may not be known. This pain may take several weeks or longer to get better. HOME CARE Pay attention to any changes in your symptoms. Take these actions to help with your pain:  Rest as told by your doctor.  Avoid activities that cause pain. Try not to use your chest, belly (abdominal), or side muscles to lift heavy things.  If directed, apply ice to the painful area:  Put ice in a plastic bag.  Place a towel between your skin and the bag.  Leave the ice on for 20 minutes, 2-3 times per day.  Take over-the-counter and prescription medicines only as told by your doctor.  Do not use tobacco products, including cigarettes, chewing tobacco, and e-cigarettes. If you need help quitting, ask your doctor.  Keep all follow-up visits as told by your doctor. This is important. GET HELP IF:  You have a fever.  Your chest pain gets worse.  You have new symptoms. GET HELP RIGHT AWAY IF:  You feel sick to your stomach (nauseous) or you throw up (vomit).  You feel sweaty or light-headed.  You have a cough with phlegm (sputum) or you cough up blood.  You are short of breath.   This information is not intended to replace advice given to you by your health care provider. Make sure you discuss any questions you have with your health care provider.   Document Released: 06/25/2007 Document Revised: 09/27/2014 Document Reviewed: 04/03/2014 Elsevier Interactive Patient Education [employer]    Begin taking etodolac with food today. Follow-up with [facility] clinic if any continued problems.

## 2015-02-27 NOTE — ED Notes (Signed)
States she developed some pain to left upper chest while working this am.   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

## 2015-02-27 NOTE — ED Provider Notes (Signed)
EKG: Interpreted by me, normal sinus rhythm, normal PR interval, normal QRS, normal QT interval. Normal axis.  Emily Filbert, MD 02/27/15 [phone]

## 2015-02-27 NOTE — ED Provider Notes (Signed)
[facility] Emergency Department Provider Note  ____________________________________________  Time seen: Approximately 7:31 AM  I have reviewed the triage vital signs and the nursing notes.   HISTORY  Chief Complaint Chest Pain   HPI Kathy Henry is a 25 y.o. female is here with complaint of left upper chest pain while working this morning. Patient states that pain increases with inspiration and movement. She denies any previous chest pain or cardiac history. Patient states she does a lot of repetitive motion at work and does a lot of lifting. She has not taken any over-the-counter medication for prior to being seen.Patient states she left work and came to the emergency room despite coworkers's thinking that she needs to take an ambulance to the emergency room. She denies any coughing, fever, medical or recent illnesses. Denies any indigestion or heartburn and is no history of gallbladder disease. She rates her pain as 10 out of 10.   History reviewed. No pertinent past medical history.  There are no active problems to display for this patient.   Past Surgical History  Procedure Laterality Date  . Fracture surgery      Current Outpatient Rx  Name  Route  Sig  Dispense  Refill  . albuterol (PROVENTIL HFA;VENTOLIN HFA) 108 (90 BASE) MCG/ACT inhaler   Inhalation   Inhale 2 puffs into the lungs every 6 (six) hours as needed for wheezing or shortness of breath.   1 Inhaler   2   . etodolac (LODINE) 400 MG tablet   Oral   Take 1 tablet (400 mg total) by mouth 2 (two) times daily.   20 tablet   0   . fexofenadine-pseudoephedrine (ALLEGRA-D) 60-120 MG 12 hr tablet   Oral   Take 1 tablet by mouth 2 (two) times daily.   30 tablet   0     Allergies Review of patient's allergies indicates no known allergies.  No family history on file.  Social History Social History  Substance Use Topics  . Smoking status: Former Games developer  . Smokeless tobacco:  None  . Alcohol Use: No    Review of Systems Constitutional: No fever/chills Eyes: No visual changes. ENT: No sore throat. Cardiovascular: Left-sided chest pain. Respiratory: Denies shortness of breath. The pain is increased with deep inspiration. Gastrointestinal: No abdominal pain.  No nausea, no vomiting.   Musculoskeletal: Negative for back pain. Positive left-sided chest pain. Skin: Negative for rash. Neurological: Negative for headaches, focal weakness or numbness.  10-point ROS otherwise negative.  ____________________________________________   PHYSICAL EXAM:  VITAL SIGNS: ED Triage Vitals  Enc Vitals Group     BP 02/27/15 0556 129/84 mmHg     Pulse Rate 02/27/15 0556 81     Resp 02/27/15 0556 18     Temp 02/27/15 0556 98 F (36.7 C)     Temp Source 02/27/15 0556 Oral     SpO2 02/27/15 0556 100 %     Weight 02/27/15 0556 115 lb (52.164 kg)     Height 02/27/15 0556  (1.727 m)     Head Cir --      Peak Flow --      Pain Score 02/27/15 0555 10     Pain Loc --      Pain Edu? --      Excl. in GC? --     Constitutional: Alert and oriented. Well appearing and in no acute distress. Eyes: Conjunctivae are normal. PERRL. EOMI. Head: Atraumatic. Nose: No congestion/rhinnorhea. Mouth/Throat:  Mucous membranes are moist.  Oropharynx non-erythematous. Neck: No stridor.  Supple Hematological/Lymphatic/Immunilogical: No cervical lymphadenopathy. Cardiovascular: Normal rate, regular rhythm. Grossly normal heart sounds.  Good peripheral circulation. Respiratory: Normal respiratory effort.  No retractions. Lungs CTAB.  Left anterior chest wall is moderately tender to palpation and pain is reproduced with exertion of bilateral shoulders. He consistently is tender in the same area. Gastrointestinal: Soft and nontender. No distention. No abdominal bruits. No CVA tenderness. Musculoskeletal: No lower extremity tenderness nor edema.  No joint effusions. Neurologic:  Normal  speech and language. No gross focal neurologic deficits are appreciated. No gait instability. Skin:  Skin is warm, dry and intact. No rash noted. Psychiatric: Mood and affect are normal. Speech and behavior are normal.  ____________________________________________   LABS (all labs ordered are listed, but only abnormal results are displayed)  Labs Reviewed  BASIC METABOLIC PANEL - Abnormal; Notable for the following:    Anion gap 3 (*)    All other components within normal limits  CBC  TROPONIN I   ____________________________________________  EKG  Dictated by Dr. Mayford Knife. ____________________________________________  RADIOLOGY  Chest x-ray showed no acute cardiopulmonary disease. ____________________________________________   PROCEDURES  Procedure(s) performed: None  Critical Care performed: No  ____________________________________________   INITIAL IMPRESSION / ASSESSMENT AND PLAN / ED COURSE  Pertinent labs & imaging results that were available during my care of the patient were reviewed by me and considered in my medical decision making (see chart for details).  Patient was given prescription for etodolac milligrams twice a day with food. Patient was given a note to return to work with light duty. Patient is to follow-up with her doctor or [facility] if any continued problems. ____________________________________________   FINAL CLINICAL IMPRESSION(S) / ED DIAGNOSES  Final diagnoses:  Acute chest wall pain      Tommi Rumps, PA-C 02/27/15 1732  Emily Filbert, MD 03/20/15 [phone]

## 2015-02-27 NOTE — ED Notes (Signed)
Patient ambulatory to triage with steady gait, without difficulty or distress noted; pt reports while at work began having mid CP accomp by SOB; denies recent illness, denies cough

## 2015-07-25 ENCOUNTER — Encounter: Payer: Self-pay | Admitting: Emergency Medicine

## 2015-07-25 ENCOUNTER — Emergency Department: Payer: No Typology Code available for payment source

## 2015-07-25 ENCOUNTER — Emergency Department
Admission: EM | Admit: 2015-07-25 | Discharge: 2015-07-25 | Disposition: A | Discharge status: Home / Self Care | Payer: No Typology Code available for payment source | Attending: Emergency Medicine | Admitting: Emergency Medicine

## 2015-07-25 DIAGNOSIS — S299XXA Unspecified injury of thorax, initial encounter: Secondary | ICD-10-CM

## 2015-07-25 DIAGNOSIS — Y999 Unspecified external cause status: Secondary | ICD-10-CM | POA: Insufficient documentation

## 2015-07-25 DIAGNOSIS — Z87891 Personal history of nicotine dependence: Secondary | ICD-10-CM | POA: Insufficient documentation

## 2015-07-25 DIAGNOSIS — Z7951 Long term (current) use of inhaled steroids: Secondary | ICD-10-CM | POA: Diagnosis not present

## 2015-07-25 DIAGNOSIS — S29001A Unspecified injury of muscle and tendon of front wall of thorax, initial encounter: Secondary | ICD-10-CM | POA: Insufficient documentation

## 2015-07-25 DIAGNOSIS — Y92410 Unspecified street and highway as the place of occurrence of the external cause: Secondary | ICD-10-CM | POA: Diagnosis not present

## 2015-07-25 DIAGNOSIS — R51 Headache: Secondary | ICD-10-CM | POA: Diagnosis present

## 2015-07-25 DIAGNOSIS — Y9389 Activity, other specified: Secondary | ICD-10-CM | POA: Insufficient documentation

## 2015-07-25 MED ORDER — IBUPROFEN 600 MG PO TABS
600.0000 mg | ORAL_TABLET | Freq: Once | ORAL | Status: AC
  Administered 2015-07-25: 600 mg via ORAL
  Filled 2015-07-25: qty 1

## 2015-07-25 MED ORDER — IBUPROFEN 400 MG PO TABS: 400.0000 mg | ORAL_TABLET | Freq: Four times a day (QID) | ORAL | Status: DC | PRN
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
## 2015-07-25 NOTE — ED Notes (Signed)
Pt able to ambulate without difficulty and without distress. Pt verbalized understanding of d/c and follow up as needed to PCP. Pt verbalized having all belongings and upon cleaning the room no belongings were found.

## 2015-08-16 ENCOUNTER — Emergency Department
Admission: EM | Admit: 2015-08-16 | Discharge: 2015-08-17 | Disposition: A | Discharge status: Other Acute Inpatient Hospital | Payer: Self-pay | Attending: Student | Admitting: Student

## 2015-08-16 ENCOUNTER — Encounter: Payer: Self-pay | Admitting: Emergency Medicine

## 2015-08-16 DIAGNOSIS — S51812A Laceration without foreign body of left forearm, initial encounter: Secondary | ICD-10-CM | POA: Insufficient documentation

## 2015-08-16 DIAGNOSIS — R45851 Suicidal ideations: Secondary | ICD-10-CM

## 2015-08-16 DIAGNOSIS — Z79899 Other long term (current) drug therapy: Secondary | ICD-10-CM | POA: Insufficient documentation

## 2015-08-16 DIAGNOSIS — Y999 Unspecified external cause status: Secondary | ICD-10-CM | POA: Insufficient documentation

## 2015-08-16 DIAGNOSIS — F32A Depression, unspecified: Secondary | ICD-10-CM

## 2015-08-16 DIAGNOSIS — X788XXA Intentional self-harm by other sharp object, initial encounter: Secondary | ICD-10-CM | POA: Insufficient documentation

## 2015-08-16 DIAGNOSIS — Z7289 Other problems related to lifestyle: Secondary | ICD-10-CM

## 2015-08-16 DIAGNOSIS — Z87891 Personal history of nicotine dependence: Secondary | ICD-10-CM | POA: Insufficient documentation

## 2015-08-16 DIAGNOSIS — F314 Bipolar disorder, current episode depressed, severe, without psychotic features: Secondary | ICD-10-CM

## 2015-08-16 DIAGNOSIS — F1910 Other psychoactive substance abuse, uncomplicated: Secondary | ICD-10-CM

## 2015-08-16 DIAGNOSIS — Z23 Encounter for immunization: Secondary | ICD-10-CM | POA: Insufficient documentation

## 2015-08-16 DIAGNOSIS — G43909 Migraine, unspecified, not intractable, without status migrainosus: Secondary | ICD-10-CM

## 2015-08-16 DIAGNOSIS — Y929 Unspecified place or not applicable: Secondary | ICD-10-CM | POA: Insufficient documentation

## 2015-08-16 DIAGNOSIS — F329 Major depressive disorder, single episode, unspecified: Secondary | ICD-10-CM | POA: Insufficient documentation

## 2015-08-16 DIAGNOSIS — Y939 Activity, unspecified: Secondary | ICD-10-CM | POA: Insufficient documentation

## 2015-08-16 DIAGNOSIS — F1290 Cannabis use, unspecified, uncomplicated: Secondary | ICD-10-CM | POA: Insufficient documentation

## 2015-08-16 DIAGNOSIS — Z5181 Encounter for therapeutic drug level monitoring: Secondary | ICD-10-CM | POA: Insufficient documentation

## 2015-08-16 HISTORY — DX: Major depressive disorder, single episode, unspecified: F32.9

## 2015-08-16 HISTORY — DX: Bipolar disorder, unspecified: F31.9

## 2015-08-16 HISTORY — DX: Obsessive-compulsive disorder, unspecified: F42.9

## 2015-08-16 HISTORY — DX: Depression, unspecified: F32.A

## 2015-08-16 HISTORY — DX: Anxiety disorder, unspecified: F41.9

## 2015-08-16 LAB — COMPREHENSIVE METABOLIC PANEL
ALK PHOS: 42 U/L (ref 38–126)
ALT: 15 U/L (ref 14–54)
AST: 22 U/L (ref 15–41)
Albumin: 4.2 g/dL (ref 3.5–5.0)
Anion gap: 9 (ref 5–15)
BUN: 11 mg/dL (ref 6–20)
CALCIUM: 9 mg/dL (ref 8.9–10.3)
CHLORIDE: 104 mmol/L (ref 101–111)
CO2: 24 mmol/L (ref 22–32)
CREATININE: 0.78 mg/dL (ref 0.44–1.00)
GFR calc Af Amer: >60 mL/min (ref >60)
GFR calc non Af Amer: >60 mL/min (ref >60)
Glucose, Bld: 97 mg/dL (ref 65–99)
Potassium: 3.4 mmol/L — ABNORMAL LOW (ref 3.5–5.1)
SODIUM: 137 mmol/L (ref 135–145)
Total Bilirubin: 0.8 mg/dL (ref 0.3–1.2)
Total Protein: 7.1 g/dL (ref 6.5–8.1)

## 2015-08-16 LAB — URINE DRUG SCREEN, QUALITATIVE (ARMC ONLY)
Amphetamines, Ur Screen: NOT DETECTED
Barbiturates, Ur Screen: NOT DETECTED
Benzodiazepine, Ur Scrn: POSITIVE — AB
CANNABINOID 50 NG, UR ~~LOC~~: POSITIVE — AB
COCAINE METABOLITE, UR ~~LOC~~: POSITIVE — AB
MDMA (ECSTASY) UR SCREEN: NOT DETECTED
Methadone Scn, Ur: NOT DETECTED
OPIATE, UR SCREEN: NOT DETECTED
PHENCYCLIDINE (PCP) UR S: NOT DETECTED
Tricyclic, Ur Screen: NOT DETECTED

## 2015-08-16 LAB — ACETAMINOPHEN LEVEL: Acetaminophen (Tylenol), Serum: <10 ug/mL — ABNORMAL LOW (ref 10–30)

## 2015-08-16 LAB — CBC
HEMATOCRIT: 38.4 % (ref 35.0–47.0)
HEMOGLOBIN: 12.9 g/dL (ref 12.0–16.0)
MCH: 30.9 pg (ref 26.0–34.0)
MCHC: 33.7 g/dL (ref 32.0–36.0)
MCV: 91.7 fL (ref 80.0–100.0)
Platelets: 321 10*3/uL (ref 150–440)
RBC: 4.18 MIL/uL (ref 3.80–5.20)
RDW: 12.8 % (ref 11.5–14.5)
WBC: 11.4 10*3/uL — ABNORMAL HIGH (ref 3.6–11.0)

## 2015-08-16 LAB — ETHANOL: Alcohol, Ethyl (B): <5 mg/dL (ref <5)

## 2015-08-16 LAB — HCG, QUANTITATIVE, PREGNANCY: hCG, Beta Chain, Quant, S: <1 m[IU]/mL (ref <5)

## 2015-08-16 LAB — SALICYLATE LEVEL: Salicylate Lvl: <4 mg/dL (ref 2.8–30.0)

## 2015-08-16 MED ORDER — LORAZEPAM 1 MG PO TABS: 1.0000 mg | ORAL_TABLET | ORAL | Status: DC | PRN

## 2015-08-16 MED ORDER — QUETIAPINE FUMARATE 25 MG PO TABS
100.0000 mg | ORAL_TABLET | Freq: Every day | ORAL | Status: DC
  Administered 2015-08-17: 100 mg via ORAL
  Filled 2015-08-16: qty 4

## 2015-08-16 MED ORDER — TETANUS-DIPHTH-ACELL PERTUSSIS 5-2.5-18.5 LF-MCG/0.5 IM SUSP
0.5000 mL | Freq: Once | INTRAMUSCULAR | Status: AC
  Administered 2015-08-16: 0.5 mL via INTRAMUSCULAR
  Filled 2015-08-16: qty 0.5

## 2015-08-16 NOTE — ED Provider Notes (Signed)
-----------------------------------------   5:44 PM on 08/16/2015 -----------------------------------------   Blood pressure 112/73, pulse 75, temperature 98.3 F (36.8 C), temperature source Oral, resp. rate 14, height 5\' 8"  (1.727 m), weight 54.4 kg, last menstrual period 07/18/2015, SpO2 100 %.  The patient had no acute events since last update.  Calm and cooperative at this time.  Dr. Toni Amend talked with me in person and said the patient will be admitted to behavioral medicine.   Loleta Rose, MD 08/16/15 1745
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
## 2015-08-16 NOTE — ED Notes (Signed)
Pt. States she gave her wallet and phone to a lady she calls mom (April McMillian).  Pt. Concerned about her stuff.  Pt. Did not know April McMillian's phone #.  Told pt. If April McMillian called I would relay information about phone and wallet.

## 2015-08-16 NOTE — ED Notes (Signed)
Sister called and asked about pt., took Sarah's # [phone].  Told sister I would relay message to pt.Marland Kitchen
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
## 2015-08-16 NOTE — Progress Notes (Signed)
A behavioral health evaluation unable to be completed at this time. Pt was unable to offer information due to altered mental status. Pt lethargic and unable to be aroused.  Pt was not awakened by voice and actively resists arousal.  Pts nurse and EDT updated.  08/16/2015 Cheryl Flash, MS, NCC, LPCA Therapeutic Triage Specialist

## 2015-08-16 NOTE — ED Provider Notes (Signed)
[facility] Emergency Department Provider Note   ____________________________________________   First MD Initiated Contact with Patient 08/16/15 1202     (approximate)  I have reviewed the triage vital signs and the nursing notes.   HISTORY  Chief Complaint Suicidal    HPI Kathy Henry is a 25 y.o. female with history of bipolar disorder who presents for evaluation of suicidal ideation, under involuntary commitment. Patient reports that she has had long-standing suicidal ideation for at least 7 years, gradual onset, however has become much more severe today in the setting of her significant other who has overdosed. She also reports that she has been fighting with her significant other and 3 days ago she cut her left arm with scissors " so I wouldn't feel the pain anymore". She thinks her last tetanus shot was received 6 years ago but she is not sure. No homicidal ideation. No audiovisual hallucinations or no chest pain, difficulty breathing, vomiting, diarrhea, fevers or chills.   Past Medical History:  Diagnosis Date  . Anxiety   . Bipolar 1 disorder (HCC)   . Depression   . OCD (obsessive compulsive disorder)     There are no active problems to display for this patient.   Past Surgical History:  Procedure Laterality Date  . FRACTURE SURGERY      Prior to Admission medications   Medication Sig Start Date End Date Taking? Authorizing Provider  albuterol (PROVENTIL HFA;VENTOLIN HFA) 108 (90 BASE) MCG/ACT inhaler Inhale 2 puffs into the lungs every 6 (six) hours as needed for wheezing or shortness of breath. 06/27/14   Minna Antis, MD  etodolac (LODINE) 400 MG tablet Take 1 tablet (400 mg total) by mouth 2 (two) times daily. 02/27/15   Tommi Rumps, PA-C  fexofenadine-pseudoephedrine (ALLEGRA-D) 60-120 MG 12 hr tablet Take 1 tablet by mouth 2 (two) times daily. 01/30/15   Joni Reining, PA-C  ibuprofen (ADVIL,MOTRIN) 400 MG tablet Take 1  tablet (400 mg total) by mouth every 6 (six) hours as needed. 07/25/15   Jennye Moccasin, MD    Allergies Review of patient's allergies indicates no known allergies.  No family history on file.  Social History Social History  Substance Use Topics  . Smoking status: Former Games developer  . Smokeless tobacco: Not on file  . Alcohol use No    Review of Systems Constitutional: No fever/chills Eyes: No visual changes. ENT: No sore throat. Cardiovascular: Denies chest pain. Respiratory: Denies shortness of breath. Gastrointestinal: No abdominal pain.  No nausea, no vomiting.  No diarrhea.  No constipation. Genitourinary: Negative for dysuria. Musculoskeletal: Negative for back pain. Skin: Negative for rash. Neurological: Negative for headaches, focal weakness or numbness.  10-point ROS otherwise negative.  ____________________________________________   PHYSICAL EXAM:  VITAL SIGNS: ED Triage Vitals  Enc Vitals Group     BP 08/16/15 1031 112/73     Pulse Rate 08/16/15 1031 75     Resp 08/16/15 1031 14     Temp 08/16/15 1031 98.3 F (36.8 C)     Temp Source 08/16/15 1031 Oral     SpO2 08/16/15 1031 100 %     Weight 08/16/15 1031 120 lb (54.4 kg)     Height 08/16/15 1031 5\' 8"  (1.727 m)     Head Circumference --      Peak Flow --      Pain Score 08/16/15 1102 10     Pain Loc --      Pain Edu? --  Excl. in GC? --     Constitutional: Alert and oriented. Nontoxic-appearing and in no acute distress. Eyes: Conjunctivae are normal. PERRL. EOMI. Head: Atraumatic. Nose: No congestion/rhinnorhea. Mouth/Throat: Mucous membranes are moist.  Oropharynx non-erythematous. Neck: No stridor.   Cardiovascular: Normal rate, regular rhythm. Grossly normal heart sounds.  Good peripheral circulation. Respiratory: Normal respiratory effort.  No retractions. Lungs CTAB. Gastrointestinal: Soft and nontender. No distention.  No CVA tenderness. Genitourinary: deferred Musculoskeletal: No  lower extremity tenderness nor edema.  No joint effusions. 3 cm healing/old hemostatic superficial laceration in the left forearm revealing underlying adipose tissue. 2+ left radial pulse. Neurologic:  Normal speech and language. No gross focal neurologic deficits are appreciated. No gait instability. Skin:  Skin is warm, dry and intact. No rash noted. Psychiatric: Mood and affect are normal. Speech and behavior are normal.  ____________________________________________   LABS (all labs ordered are listed, but only abnormal results are displayed)  Labs Reviewed  COMPREHENSIVE METABOLIC PANEL - Abnormal; Notable for the following:       Result Value   Potassium 3.4 (*)    All other components within normal limits  ACETAMINOPHEN LEVEL - Abnormal; Notable for the following:    Acetaminophen (Tylenol), Serum <10 (*)    All other components within normal limits  CBC - Abnormal; Notable for the following:    WBC 11.4 (*)    All other components within normal limits  ETHANOL  SALICYLATE LEVEL  HCG, QUANTITATIVE, PREGNANCY  URINE DRUG SCREEN, QUALITATIVE ([facility] ONLY)   ____________________________________________  EKG  none ____________________________________________  RADIOLOGY  none ____________________________________________   PROCEDURES  Procedure(s) performed:   LACERATION REPAIR Performed by: Gayla Doss Authorized by: Toney Rakes A Consent: Verbal consent obtained. Risks and benefits: risks, benefits and alternatives were discussed Consent given by: patient Patient identity confirmed: provided demographic data Prepped and Draped in normal sterile fashion Wound explored  Laceration Location: left forearm  Laceration Length: 3 cm  No Foreign Bodies seen or palpated  Anesthesia: local infiltration  Local anesthetic: none  Anesthetic total: none  Irrigation method: syringe Amount of cleaning: standard  Skin closure: loose approximation with  steri-strips   Patient tolerance: Patient tolerated the procedure well with no immediate complications.  Procedures  Critical Care performed: No  ____________________________________________   INITIAL IMPRESSION / ASSESSMENT AND PLAN / ED COURSE  Pertinent labs & imaging results that were available during my care of the patient were reviewed by me and considered in my medical decision making (see chart for details).  Kathy Henry is a 25 y.o. female with history of bipolar disorder who presents for evaluation of suicidal ideation, under involuntary commitment. Patient reports that she has had long-standing suicidal ideation for at least 7 years. On exam, she is nontoxic appearing and in no acute distress. Vital signs stable, she is afebrile. She has an intact neurological examination and no acute medical complaints with the exception of the healing superficial laceration in the left forearm, no tendon involvement, no evidence of superinfection. This occurred 3 days ago and does appear old so she is not a candidate for repair given elevated infection risk, will irrigate and loosely approximate with Steri-Strips. We'll continue IVC, consult psychiatry, Behavioral Health and obtain screening labs. We will update her tetanus.   ----------------------------------------- 3:37 PM on 08/16/2015 ----------------------------------------- Patient resting comfortably, laceration repair. Screening labs are generally unremarkable. Still awaiting urine drug screen. Patient is medically cleared for psychiatric evaluation. Care to Dr. York Cerise.  Clinical Course  ____________________________________________   FINAL CLINICAL IMPRESSION(S) / ED DIAGNOSES  Final diagnoses:  Polysubstance abuse  Self-mutilation  Suicidal ideation      NEW MEDICATIONS STARTED DURING THIS VISIT:  New Prescriptions   No medications on file     Note:  This document was prepared using Dragon voice  recognition software and may include unintentional dictation errors.    Gayla Doss, MD 08/16/15 1538

## 2015-08-17 ENCOUNTER — Inpatient Hospital Stay
Admission: RE | Admit: 2015-08-17 | Discharge: 2015-08-20 | DRG: 885 | Disposition: A | Discharge status: Home / Self Care | Payer: No Typology Code available for payment source | Source: Intra-hospital | Attending: Psychiatry | Admitting: Psychiatry

## 2015-08-17 DIAGNOSIS — F1310 Sedative, hypnotic or anxiolytic abuse, uncomplicated: Secondary | ICD-10-CM

## 2015-08-17 DIAGNOSIS — Z62810 Personal history of physical and sexual abuse in childhood: Secondary | ICD-10-CM | POA: Diagnosis present

## 2015-08-17 DIAGNOSIS — F1220 Cannabis dependence, uncomplicated: Secondary | ICD-10-CM | POA: Diagnosis not present

## 2015-08-17 DIAGNOSIS — R45851 Suicidal ideations: Secondary | ICD-10-CM | POA: Diagnosis present

## 2015-08-17 DIAGNOSIS — F17200 Nicotine dependence, unspecified, uncomplicated: Secondary | ICD-10-CM

## 2015-08-17 DIAGNOSIS — G4700 Insomnia, unspecified: Secondary | ICD-10-CM | POA: Diagnosis present

## 2015-08-17 DIAGNOSIS — F609 Personality disorder, unspecified: Secondary | ICD-10-CM

## 2015-08-17 DIAGNOSIS — Z560 Unemployment, unspecified: Secondary | ICD-10-CM | POA: Diagnosis not present

## 2015-08-17 DIAGNOSIS — F1410 Cocaine abuse, uncomplicated: Secondary | ICD-10-CM | POA: Diagnosis not present

## 2015-08-17 DIAGNOSIS — G43909 Migraine, unspecified, not intractable, without status migrainosus: Secondary | ICD-10-CM | POA: Diagnosis present

## 2015-08-17 DIAGNOSIS — F1120 Opioid dependence, uncomplicated: Secondary | ICD-10-CM

## 2015-08-17 DIAGNOSIS — F17210 Nicotine dependence, cigarettes, uncomplicated: Secondary | ICD-10-CM | POA: Diagnosis present

## 2015-08-17 DIAGNOSIS — F332 Major depressive disorder, recurrent severe without psychotic features: Secondary | ICD-10-CM | POA: Diagnosis not present

## 2015-08-17 DIAGNOSIS — Z8041 Family history of malignant neoplasm of ovary: Secondary | ICD-10-CM

## 2015-08-17 DIAGNOSIS — F603 Borderline personality disorder: Secondary | ICD-10-CM

## 2015-08-17 LAB — LIPID PANEL
CHOL/HDL RATIO: 3.7 ratio
Cholesterol: 157 mg/dL (ref 0–200)
HDL: 43 mg/dL (ref >40)
LDL CALC: 103 mg/dL — AB (ref 0–99)
Triglycerides: 54 mg/dL (ref <150)
VLDL: 11 mg/dL (ref 0–40)

## 2015-08-17 LAB — HEMOGLOBIN A1C: Hgb A1c MFr Bld: 4.7 % (ref 4.0–6.0)

## 2015-08-17 LAB — TSH: TSH: 1.824 u[IU]/mL (ref 0.350–4.500)

## 2015-08-17 MED ORDER — FLUOXETINE HCL 10 MG PO CAPS
10.0000 mg | ORAL_CAPSULE | Freq: Every day | ORAL | Status: DC
  Administered 2015-08-17 – 2015-08-20 (×4): 10 mg via ORAL
  Filled 2015-08-17 (×4): qty 1

## 2015-08-17 MED ORDER — TRAZODONE HCL 100 MG PO TABS
100.0000 mg | ORAL_TABLET | Freq: Every day | ORAL | Status: DC
  Administered 2015-08-17 – 2015-08-19 (×3): 100 mg via ORAL
  Filled 2015-08-17 (×3): qty 1

## 2015-08-17 MED ORDER — MAGNESIUM HYDROXIDE 400 MG/5ML PO SUSP: 30.0000 mL | Freq: Every day | ORAL | Status: DC | PRN

## 2015-08-17 MED ORDER — ALUM & MAG HYDROXIDE-SIMETH 200-200-20 MG/5ML PO SUSP: 30.0000 mL | ORAL | Status: DC | PRN

## 2015-08-17 MED ORDER — HALOPERIDOL 0.5 MG PO TABS
0.5000 mg | ORAL_TABLET | Freq: Three times a day (TID) | ORAL | Status: DC
  Administered 2015-08-17 – 2015-08-20 (×9): 0.5 mg via ORAL
  Filled 2015-08-17 (×10): qty 1

## 2015-08-17 MED ORDER — QUETIAPINE FUMARATE 100 MG PO TABS: 100.0000 mg | ORAL_TABLET | Freq: Every day | ORAL | Status: DC

## 2015-08-17 MED ORDER — LORAZEPAM 1 MG PO TABS: 1.0000 mg | ORAL_TABLET | ORAL | Status: DC | PRN

## 2015-08-17 MED ORDER — HYDROXYZINE HCL 50 MG PO TABS
50.0000 mg | ORAL_TABLET | Freq: Three times a day (TID) | ORAL | Status: DC | PRN
  Administered 2015-08-17 – 2015-08-19 (×4): 50 mg via ORAL
  Filled 2015-08-17 (×4): qty 1

## 2015-08-17 MED ORDER — ACETAMINOPHEN 325 MG PO TABS: 650.0000 mg | ORAL_TABLET | Freq: Four times a day (QID) | ORAL | Status: DC | PRN

## 2015-08-17 NOTE — Tx Team (Signed)
Interdisciplinary Treatment Plan Update (Adult)         Date: 08/17/2015   Time Reviewed: 10:30 AM   Progress in Treatment: Improving Attending groups: Yes  Participating in groups: Yes  Taking medication as prescribed: Yes  Tolerating medication: Yes  Family/Significant other contact made: CSW contacted friend, Nyisha Burden Patient understands diagnosis: Yes  Discussing patient identified problems/goals with staff: Yes  Medical problems stabilized or resolved: Yes  Denies suicidal/homicidal ideation: Yes  Issues/concerns per patient self-inventory: Yes  Other:   New problem(s) identified: N/A   Discharge Plan or Barriers: see below   Reason for Continuation of Hospitalization:   Depression   Anxiety   Medication Stabilization   Comments: N/A   Estimated length of stay: 3-5 days    Patient is a 25 year old female admitted for suicidal ideation. Patient lives in [place], [place]. Patient will benefit from crisis stabilization, medication evaluation, group therapy, and psycho education in addition to case management for discharge planning. Patient and CSW reviewed pt's identified goals and treatment plan. Pt verbalized understanding and agreed to treatment plan.    Review of initial/current patient goals per problem list:  1. Goal(s): Patient will participate in aftercare plan   Met: No  Target date: 3-5 days post admission date   As evidenced by: Patient will participate within aftercare plan AEB aftercare provider and housing plan at discharge being identified.   CSW is assessing proper aftercare plans  2. Goal (s): Patient will exhibit decreased depressive symptoms and suicidal ideations.   Met: No  Target date: 3-5 days post admission date   As evidenced by: Patient will utilize self-rating of depression at 3 or below and demonstrate decreased signs of depression or be deemed stable for discharge by MD.   08/17/15: Pt reports a depression score of a 10, she  is still feeling suicidal.  3. Goal(s): Patient will demonstrate decreased signs and symptoms of anxiety.   Met: No   Target date: 3-5 days post admission date   As evidenced by: Patient will utilize self-rating of anxiety at 3 or below and demonstrated decreased signs of anxiety, or be deemed stable for discharge by MD   08/17/15: Pt is still experiencing anxiety symptoms - reports a score of a 8 at this time.   Attendees:  Patient: Kathy Henry Family:  Physician: Merlyn Albert , MD    08/17/2015 10:30AM  Nursing:Janet Jones,RN     08/17/2015 10:30AM  Clinical Social Worker: Glorious Peach, [place]  08/17/2015 10:30AM
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
## 2015-08-17 NOTE — Tx Team (Signed)
Initial Interdisciplinary Treatment Plan   PATIENT STRESSORS: Marital or family conflict Substance abuse Traumatic event   PATIENT STRENGTHS: Motivation for treatment/growth Religious Affiliation   PROBLEM LIST: Problem List/Patient Goals Date to be addressed Date deferred Reason deferred Estimated date of resolution   Anxiety  08/17/15           Depression 08/17/15           bipolar 08/17/15                              DISCHARGE CRITERIA:  Improved stabilization in mood, thinking, and/or behavior Motivation to continue treatment in a less acute level of care Verbal commitment to aftercare and medication compliance  PRELIMINARY DISCHARGE PLAN: Outpatient therapy Participate in family therapy  PATIENT/FAMIILY INVOLVEMENT: This treatment plan has been presented to and reviewed with the patient, Kathy Henry, The patient and family have been given the opportunity to ask questions and make suggestions.  [name] [name] 08/17/2015, 5:04 AM

## 2015-08-17 NOTE — BHH Group Notes (Signed)
[facility] LCSW Group Therapy   08/17/2015  9:30am  Type of Therapy: Group Therapy   Participation Level: Did Not Attend. Patient invited to participate but declined.    Kathy Henry F. Windy Dudek, MSW, LCSWA, LCAS     

## 2015-08-17 NOTE — Progress Notes (Signed)
Admission Note:  74yr female who presents IVC in acute distress for the treatment of SI and Depression. Pt appears flat and depressed, very anxious, tearful, irritable, restless, expressing" feeling of hopelessness and emptiness"   Pt was tearful throughout the admission process but was able to participate with admission. Pt presents with passive SI and contracts for safety upon admission. Pt denies AVH . Pt experienced SI after her Partner overdosed and she states that her partner  is currently in the ICU. Pt has Past medical Hx of Depression, Anxiety and Bipolar. Skin was assessed and found to be warm and dry, superficial cut noted to left arm, with steri strips intact no drainage noted. Patient was searched and no contraband found, POC and unit policies explained and understanding verbalized. Consents obtained. Food and fluids offered, and both  accepted. Pt had no additional questions or concerns.Patient was oriented to the unit and shown her room. 15 minutes checks maintained, will continue to monitor.

## 2015-08-17 NOTE — Progress Notes (Signed)
D:  Patient expressing passive SI.  States "I just don't want to be in this world anymore"  Patient is tearful and at times has difficulty stopping crying.  Verbalizes that she is alone and she has no one.  A:  Support and encouragement given.  Allowed verbalizations of feelings.  Medicated x 1 for anxiety.   R:  Safety maintained.  Medication compliant.
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
## 2015-08-17 NOTE — [facility] Suicide Risk Assessment (Signed)
[facility] INPATIENT:  Family/Significant Other Suicide Prevention Education  Suicide Prevention Education:  Education Completed; En[place]orts Burden ph#: [phone] has been identified by the patient as the family member/significant other with whom the patient will be residing, and identified as the person(s) who will aid the patient in the event of a mental health crisis (suicidal ideations/suicide attempt).  With written consent from the patient, the family member/significant other has been provided the following suicide prevention education, prior to the and/or following the discharge of the patient. Ms. Burden stated that pt has been chronically depressed for the last two years and believe she needs aggressive treatment at this time. She stated that pt did not want father involved, but Ms. Burden contacted father to keep him informed about pt's treatment.  The suicide prevention education provided includes the following:  Suicide risk factors  Suicide prevention and interventions  National Suicide Hotline telephone number  [facility] assessment telephone number  [facility] Emergency Assistance 911  [facility] and/or Residential Mobile Crisis Unit telephone number  Request made of family/significant other to:  Remove weapons (e.g., guns, rifles, knives), all items previously/currently identified as safety concern.    Remove drugs/medications (over-the-counter, prescriptions, illicit drugs), all items previously/currently identified as a safety concern.  The family member/significant other verbalizes understanding of the suicide prevention education information provided.  The family member/significant other agrees to remove the items of safety concern listed above.  Lynden Oxford, MSW, LCSW-A 08/17/2015, 12:55 PM
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
## 2015-08-18 LAB — PROLACTIN: PROLACTIN: 43.1 ng/mL — AB (ref 4.8–23.3)

## 2015-08-18 MED ORDER — MUPIROCIN 2 % EX OINT
1.0000 "application " | TOPICAL_OINTMENT | Freq: Every day | CUTANEOUS | Status: DC
  Administered 2015-08-18 – 2015-08-19 (×2): 1 via TOPICAL
  Filled 2015-08-18: qty 22

## 2015-08-18 NOTE — [facility] Group Notes (Signed)
[facility] Group Notes:  (Nursing/MHT/Case Management/Adjunct)  Date:  08/18/2015  Time:  9:27 PM  Type of Therapy:  Evening Wrap-up Group  Participation Level:  Active  Participation Quality:  Appropriate and Attentive  Affect:  Appropriate  Cognitive:  Alert and Appropriate  Insight:  Appropriate, Good and Improving  Engagement in Group:  Developing/Improving and Engaged  Modes of Intervention:  Activity  Summary of Progress/Problems:  Tomasita Morrow 08/18/2015, 9:27 PM

## 2015-08-18 NOTE — Progress Notes (Signed)
D: Pt denies SI/HI/AVH. Pt is pleasant and cooperative. Pt stated she feels better from from resting, patient is less tearful, appears less anxious and she is interacting with peers and staff appropriately. Thoughts are logical and coherent no bizarre behavior noted.  A: Pt was offered support and encouragement. Pt was given scheduled medications. Pt was encouraged to attend groups. Q 15 minute checks were done for safety.  R:Pt attends groups and interacts well with peers and staff. Pt is taking medication. Pt has no complaints.Pt receptive to treatment and safety maintained on unit.

## 2015-08-18 NOTE — [facility] Group Notes (Signed)
[facility] LCSW Group Therapy  08/18/2015 3:22 PM  Type of Therapy:  Group Therapy  Participation Level:  Active  Participation Quality:  Attentive  Affect:  Appropriate  Cognitive:  Alert  Insight:  Improving  Engagement in Therapy:  Engaged  Modes of Intervention:  Activity, Discussion, Education, Reality Testing and Support  Summary of Progress/Problems: Self care: Patients discussed self care and how it impacts them. Patients were asked to define self care in their own words. They discussed what aspects in their lives has influenced their self care. Patients also discussed self care in the areas of self regulation/control, hygiene/appearance, sleep/relaxation, healthy leisure, healthy eating habits, exercise, inner peace/spirituality, self improvement, sobriety, and health management. They were challenged to identify changes that are needed in order to improve self care. Pt was willing to share during group and offered support to peers who share difficult stories. Pt stated she has trouble sleeping and has been that way since she little. CSW offered suggestions to help with sleeping and provided insight to pt on how self care is connected to healthy sleeping habits.   Yoshiko Keleher G. Garnette Czech MSW, LCSWA 08/18/2015, 3:25 PM

## 2015-08-18 NOTE — Progress Notes (Signed)
Pt has been pleasant and cooperative with frequent crying spells. Pt talked about events leading to hospitalization. Pt denies SI and A/V hallucinations. Pt has been active on the unit.  

## 2015-08-18 NOTE — Progress Notes (Signed)
[facility] MD Progress Note  08/18/2015 12:32 PM RELMA WIACEK  MRN:  [id number] Subjective:  The patient is a 25 year old single Caucasian female from [facility] [place]. Patient was brought in by police under petition.  Patient reported cutting herself 3 days prior with scissors. She said she is tired and doesn't want to live anymore.  Pt reports that she has been suicidal since the age of 25y.o. Pt states that she has had multiple suicide attempts. Pt reports a history of self injourius behaviors including cutting, burning and head banging.   Patient reports that her girlfrienfriend had overdosed that same day. She says that both of them have psychiatric issues and that they were planning on coming together to the emergency department to get help. However before they came her girlfriend overdose and is currently in ICU.   Per nursing:Patient expressing passive SI.  States "I just don't want to be in this world anymore"  Patient is tearful and at times has difficulty stopping crying.  Verbalizes that she is alone and she has no one.    Patient has been crying uncontrollably for the last 2 days. Constantly saying that she doesn't see a sensitivity living anymore. Her girlfriend is still in ICU. But she has not received any communication from anybody and she does not have the past code 2, ICU and asked for information.  Patient reports that the medications are helping.  She is calmer and was able to sleep any last night.  She denies hallucinations or homicidal ideation. She denies side effects from medications. She denies any physical complaints.  Principal Problem: MDD (major depressive disorder), recurrent episode, severe (HCC) Diagnosis:   Patient Active Problem List   Diagnosis Date Noted  . Tobacco use disorder [F17.200] 08/17/2015  . Cannabis use disorder, severe, dependence (HCC) [F12.20] 08/17/2015  . MDD (major depressive disorder), recurrent episode, severe (HCC) [F33.2] 08/17/2015  .  Cluster B personality disorder [F60.9] 08/17/2015  . Cocaine use disorder, mild, abuse [F14.10] 08/17/2015  . 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 Opioid use disorder, moderate, dependence (HCC) [F11.20] 08/17/2015  . Suicidal ideation [R45.851] 08/16/2015  . Migraines [G43.909] 08/16/2015   Total Time spent with patient: 30 minutes  Past Psychiatric History:   Past Medical History:  Past Medical History:  Diagnosis Date  . Anxiety   . Bipolar 1 disorder (HCC)   . Depression   . OCD (obsessive compulsive disorder)     Past Surgical History:  Procedure Laterality Date  . FRACTURE SURGERY     Family History: History reviewed. No pertinent family history. F amily Psychiatric  History:   Social History:  History  Alcohol Use No     History  Drug Use  . Types: Marijuana    Social History   Social History  . Marital status: Single    Spouse name: N/A  . Number of children: N/A  . Years of education: N/A   Social History Main Topics  . Smoking status: Former Smoker    Packs/day: 0.50    Years: 5.00    Quit date: 07/18/2015  . Smokeless tobacco: Never Used  . Alcohol use No  . Drug use:     Types: Marijuana  . Sexual activity: Yes    Birth control/ protection: None   Other Topics Concern  . None   Social History Narrative  . None     Current Medications: Current Facility-Administered Medications  Medication Dose Route Frequency Provider Last Rate  Last Dose  . acetaminophen (TYLENOL) tablet 650 mg  650 mg Oral Q6H PRN Audery Amel, MD      . alum & mag hydroxide-simeth (MAALOX/MYLANTA) 200-200-20 MG/5ML suspension 30 mL  30 mL Oral Q4H PRN Audery Amel, MD      . FLUoxetine (PROZAC) capsule 10 mg  10 mg Oral Daily Jimmy Footman, MD   10 mg at 08/18/15 0919  . haloperidol (HALDOL) tablet 0.5 mg  0.5 mg Oral TID Jimmy Footman, MD   0.5 mg at 08/18/15 0919  . hydrOXYzine (ATARAX/VISTARIL) tablet 50  mg  50 mg Oral TID PRN Jimmy Footman, MD   50 mg at 08/17/15 2128  . magnesium hydroxide (MILK OF MAGNESIA) suspension 30 mL  30 mL Oral Daily PRN Audery Amel, MD      . mupirocin ointment (BACTROBAN) 2 % 1 application  1 application Topical Daily Jimmy Footman, MD   1 application at 08/18/15 1055  . traZODone (DESYREL) tablet 100 mg  100 mg Oral QHS Jimmy Footman, MD   100 mg at 08/17/15 2128    Lab Results:  Results for orders placed or performed during the hospital encounter of 08/17/15 (from the past 48 hour(s))  Hemoglobin A1c     Status: None   Collection Time: 08/17/15 11:25 AM  Result Value Ref Range   Hgb A1c MFr Bld 4.7 4.0 - 6.0 %  Lipid panel     Status: Abnormal   Collection Time: 08/17/15 11:25 AM  Result Value Ref Range   Cholesterol 157 0 - 200 mg/dL   Triglycerides 54 <454 mg/dL   HDL 43 >09 mg/dL   Total CHOL/HDL Ratio 3.7 RATIO   VLDL 11 0 - 40 mg/dL   LDL Cholesterol 811 (H) 0 - 99 mg/dL    Comment:        Total Cholesterol/HDL:CHD Risk Coronary Heart Disease Risk Table                     Men   Women  1/2 Average Risk   3.4   3.3  Average Risk       5.0   4.4  2 X Average Risk   9.6   7.1  3 X Average Risk  23.4   11.0        Use the calculated Patient Ratio above and the CHD Risk Table to determine the patient's CHD Risk.        ATP III CLASSIFICATION (LDL):  <100     mg/dL   Optimal  914-782  mg/dL   Near or Above                    Optimal  130-159  mg/dL   Borderline  956-213  mg/dL   High  >086     mg/dL   Very High   Prolactin     Status: Abnormal   Collection Time: 08/17/15 11:25 AM  Result Value Ref Range   Prolactin 43.1 (H) 4.8 - 23.3 ng/mL    Comment: (NOTE) Performed At: [facility] [address] [place], [place] [id number] Mila Homer MD [id number]   TSH     Status: None   Collection Time: 08/17/15 11:25 AM  Result Value Ref Range   TSH 1.824 0.350 - 4.500 uIU/mL     Blood Alcohol level:  Lab Results  Component Value Date   ETH <5 08/16/2015    Metabolic Disorder Labs: Lab  Results  Component Value Date   HGBA1C 4.7 08/17/2015   Lab Results  Component Value Date   PROLACTIN 43.1 (H) 08/17/2015   Lab Results  Component Value Date   CHOL 157 08/17/2015   TRIG 54 08/17/2015   HDL 43 08/17/2015   CHOLHDL 3.7 08/17/2015   VLDL 11 08/17/2015   LDLCALC 103 (H) 08/17/2015    Physical Findings: AIMS: Facial and Oral Movements Muscles of Facial Expression: None, normal Lips and Perioral Area: None, normal Jaw: None, normal Tongue: None, normal,Extremity Movements Upper (arms, wrists, hands, fingers): None, normal Lower (legs, knees, ankles, toes): None, normal, Trunk Movements Neck, shoulders, hips: None, normal, Overall Severity Severity of abnormal movements (highest score from questions above): None, normal Incapacitation due to abnormal movements: None, normal Patient's awareness of abnormal movements (rate only patient's report): No Awareness, Dental Status Current problems with teeth and/or dentures?: No Does patient usually wear dentures?: No  CIWA:  CIWA-Ar Total: 0 COWS:  COWS Total Score: 1  Musculoskeletal: Strength & Muscle Tone: within normal limits Gait & Station: normal Patient leans: N/A  Psychiatric Specialty Exam: Physical Exam  Constitutional: She is oriented to person, place, and time. She appears well-developed and well-nourished.  HENT:  Head: Normocephalic and atraumatic.  Eyes: EOM are normal.  Neck: Normal range of motion.  Respiratory: Effort normal.  Musculoskeletal: Normal range of motion.  Neurological: She is alert and oriented to person, place, and time.    Review of Systems  Constitutional: Negative.   HENT: Negative.   Eyes: Negative.   Respiratory: Negative.   Cardiovascular: Negative.   Gastrointestinal: Negative.   Genitourinary: Negative.   Musculoskeletal: Negative.   Skin:  Negative.   Neurological: Negative.   Endo/Heme/Allergies: Negative.   Psychiatric/Behavioral: Positive for depression, substance abuse and suicidal ideas. The patient is nervous/anxious and has insomnia.     Blood pressure 116/76, pulse 73, temperature 97.7 F (36.5 C), temperature source Oral, resp. rate 20, height 5' 7.72" (1.72 m), weight 50.8 kg (112 lb), last menstrual period 07/18/2015, SpO2 100 %.Body mass index is 17.17 kg/m.  General Appearance: Disheveled  Eye Contact:  Fair  Speech:  Clear and Coherent  Volume:  Normal  Mood:  Dysphoric  Affect:  Blunt  Thought Process:  Linear and Descriptions of Associations: Intact  Orientation:  Full (Time, Place, and Person)  Thought Content:  Hallucinations: None  Suicidal Thoughts:  Yes.  without intent/plan  Homicidal Thoughts:  No  Memory:  Immediate;   Good Recent;   Good Remote;   Good  Judgement:  Fair  Insight:  Fair  Psychomotor Activity:  Decreased  Concentration:  Concentration: Fair and Attention Span: Fair  Recall:  Good  Fund of Knowledge:  Fair  Language:  Good  Akathisia:  no  Handed:    AIMS (if indicated):     Assets:  Communication Skills  ADL's:  Intact  Cognition:  WNL  Sleep:  Number of Hours: 6     Treatment Plan Summary:  For major depressive disorder the patient has been started on fluoxetine 10 mg by mouth daily  Cluster B personality traits. Rule out borderline personality disorder patient reports chronic suicidality, history of self injury, multiple suicidal attempts.  For insomnia: continue trazodone 100 mg by mouth daily at bedtime  For agitation and anxiety: continue haloperidol 0.5 mg 3 times a day  For tobacco use disorder she will be offered a nicotine patch (declined)  Benzodiazepine, opioid and cocaine use: Per records K  she has had multiple visits to the emergency department with pain complaints which leads me to suspect opiate abuse. She also has tested positive for  benzodiazepines and cocaine she says she does not use cocaine regularly she believes that marijuana was laced. She does acknowledge using clonazepam from somebody else. Patient is in need of referral for outpatient substance abuse treatment.  Migraine: will write orders for ibuprofen and Tylenol when necessary   Diet regular  Precautions every 15 minute checks  Hospitalization and status continue involuntary commitment  Disposition once stable patient will refer to the homeless shelter as she says she is homeless at this point in time  Follow-up she will be referred to follow-up with [facility] for individual therapy, medication management and substance abuse treatment.  Possible d/c in 48 h  Jimmy Footman, MD 08/18/2015, 12:32 PM

## 2015-08-19 NOTE — Progress Notes (Signed)
Pt endorses Passive SI. Contracts for safety. Denies any HI/AVH. Affect depressed. Noted to be socializing more than previous evening. Appearance disheveled. Attended evening group. Compliant with medications. Stated she was feeling "somewhat better". Denies pain and voices no additional concerns at this time. Q15 minute checks maintained for safety. Encouragement and support provided. Will continue to monitor.

## 2015-08-19 NOTE — [facility] Group Notes (Signed)
[facility] Group Notes:  (Nursing/MHT/Case Management/Adjunct)  Date:  08/19/2015  Time:  10:35 PM  Type of Therapy:  Psychoeducational Skills  Participation Level:  Active  Participation Quality:  Appropriate  Affect:  Appropriate  Cognitive:  Appropriate  Insight:  Appropriate  Engagement in Group:  Engaged  Modes of Intervention:  Discussion, Socialization and Support  Summary of Progress/Problems:  Kathy Henry 08/19/2015, 10:35 PM

## 2015-08-19 NOTE — Progress Notes (Signed)
[facility] MD Progress Note  08/19/2015 10:52 AM Kathy Henry  MRN:  [id number] Subjective:  The patient is a 25 year old single Caucasian female from [facility] [place]. Patient was brought in by police under petition.  Patient reported cutting herself 3 days prior with scissors. She said she is tired and doesn't want to live anymore.  Pt reports that she has been suicidal since the age of 25y.o. Pt states that she has had multiple suicide attempts. Pt reports a history of self injourius behaviors including cutting, burning and head banging.   Patient reports that her girlfrienfriend had overdosed that same day. She says that both of them have psychiatric issues and that they were planning on coming together to the emergency department to get help. However before they came her girlfriend overdose and is currently in ICU.   Patient has been crying uncontrollably for the last 2 days. Constantly saying that she doesn't see a sensitivity living anymore. Her girlfriend is still in ICU But she has not received any communication from anybody.  Today the patient is much calmer. She was seen in the day room watching TV and interacting with peers appropriately. She wasn't tearful today during assessment seems to be more in control of her emotions. She has been sleeping and eating better. She reports good response to medications. She is no longer feeling hopeless or suicidal. She denies homicidality or psychosis. She denies any side effects from medications. She denies any physical complaints. Still she has not received any information about her girlfriend. She feels comfortable with the plans for discharge tomorrow.  Willing to continue to follow-up with [facility] upon discharge.  Principal Problem: MDD (major depressive disorder), recurrent episode, severe (HCC) Diagnosis:   Patient Active Problem List   Diagnosis Date Noted  . Tobacco use disorder [F17.200] 08/17/2015  . Cannabis use disorder, severe,  dependence (HCC) [F12.20] 08/17/2015  . MDD (major depressive disorder), recurrent episode, severe (HCC) [F33.2] 08/17/2015  . Cluster B personality disorder [F60.9] 08/17/2015  . Cocaine use disorder, mild, abuse [F14.10] 08/17/2015  . 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 Opioid use disorder, moderate, dependence (HCC) [F11.20] 08/17/2015  . Suicidal ideation [R45.851] 08/16/2015  . Migraines [G43.909] 08/16/2015   Total Time spent with patient: 30 minutes  Past Psychiatric History:   Past Medical History:  Past Medical History:  Diagnosis Date  . Anxiety   . Bipolar 1 disorder (HCC)   . Depression   . OCD (obsessive compulsive disorder)     Past Surgical History:  Procedure Laterality Date  . FRACTURE SURGERY     Family History: History reviewed. No pertinent family history. F amily Psychiatric  History:   Social History:  History  Alcohol Use No     History  Drug Use  . Types: Marijuana    Social History   Social History  . Marital status: Single    Spouse name: N/A  . Number of children: N/A  . Years of education: N/A   Social History Main Topics  . Smoking status: Former Smoker    Packs/day: 0.50    Years: 5.00    Quit date: 07/18/2015  . Smokeless tobacco: Never Used  . Alcohol use No  . Drug use:     Types: Marijuana  . Sexual activity: Yes    Birth control/ protection: None   Other Topics Concern  . None   Social History Narrative  . None     Current Medications: Current Facility-Administered  Medications  Medication Dose Route Frequency Provider Last Rate Last Dose  . acetaminophen (TYLENOL) tablet 650 mg  650 mg Oral Q6H PRN Audery Amel, MD      . alum & mag hydroxide-simeth (MAALOX/MYLANTA) 200-200-20 MG/5ML suspension 30 mL  30 mL Oral Q4H PRN Audery Amel, MD      . FLUoxetine (PROZAC) capsule 10 mg  10 mg Oral Daily Jimmy Footman, MD   10 mg at 08/19/15 0850  . haloperidol (HALDOL)  tablet 0.5 mg  0.5 mg Oral TID Jimmy Footman, MD   0.5 mg at 08/19/15 0851  . hydrOXYzine (ATARAX/VISTARIL) tablet 50 mg  50 mg Oral TID PRN Jimmy Footman, MD   50 mg at 08/18/15 2152  . magnesium hydroxide (MILK OF MAGNESIA) suspension 30 mL  30 mL Oral Daily PRN Audery Amel, MD      . mupirocin ointment (BACTROBAN) 2 % 1 application  1 application Topical Daily Jimmy Footman, MD   1 application at 08/19/15 1013  . traZODone (DESYREL) tablet 100 mg  100 mg Oral QHS Jimmy Footman, MD   100 mg at 08/18/15 2150    Lab Results:  Results for orders placed or performed during the hospital encounter of 08/17/15 (from the past 48 hour(s))  Hemoglobin A1c     Status: None   Collection Time: 08/17/15 11:25 AM  Result Value Ref Range   Hgb A1c MFr Bld 4.7 4.0 - 6.0 %  Lipid panel     Status: Abnormal   Collection Time: 08/17/15 11:25 AM  Result Value Ref Range   Cholesterol 157 0 - 200 mg/dL   Triglycerides 54 <086 mg/dL   HDL 43 >57 mg/dL   Total CHOL/HDL Ratio 3.7 RATIO   VLDL 11 0 - 40 mg/dL   LDL Cholesterol 846 (H) 0 - 99 mg/dL    Comment:        Total Cholesterol/HDL:CHD Risk Coronary Heart Disease Risk Table                     Men   Women  1/2 Average Risk   3.4   3.3  Average Risk       5.0   4.4  2 X Average Risk   9.6   7.1  3 X Average Risk  23.4   11.0        Use the calculated Patient Ratio above and the CHD Risk Table to determine the patient's CHD Risk.        ATP III CLASSIFICATION (LDL):  <100     mg/dL   Optimal  962-952  mg/dL   Near or Above                    Optimal  130-159  mg/dL   Borderline  841-324  mg/dL   High  >401     mg/dL   Very High   Prolactin     Status: Abnormal   Collection Time: 08/17/15 11:25 AM  Result Value Ref Range   Prolactin 43.1 (H) 4.8 - 23.3 ng/mL    Comment: (NOTE) Performed At: [facility] [address] [place], [place] [id number] Mila Homer MD [id number]    TSH     Status: None   Collection Time: 08/17/15 11:25 AM  Result Value Ref Range   TSH 1.824 0.350 - 4.500 uIU/mL    Blood Alcohol level:  Lab Results  Component Value Date   ETH <  5 08/16/2015    Metabolic Disorder Labs: Lab Results  Component Value Date   HGBA1C 4.7 08/17/2015   Lab Results  Component Value Date   PROLACTIN 43.1 (H) 08/17/2015   Lab Results  Component Value Date   CHOL 157 08/17/2015   TRIG 54 08/17/2015   HDL 43 08/17/2015   CHOLHDL 3.7 08/17/2015   VLDL 11 08/17/2015   LDLCALC 103 (H) 08/17/2015    Physical Findings: AIMS: Facial and Oral Movements Muscles of Facial Expression: None, normal Lips and Perioral Area: None, normal Jaw: None, normal Tongue: None, normal,Extremity Movements Upper (arms, wrists, hands, fingers): None, normal Lower (legs, knees, ankles, toes): None, normal, Trunk Movements Neck, shoulders, hips: None, normal, Overall Severity Severity of abnormal movements (highest score from questions above): None, normal Incapacitation due to abnormal movements: None, normal Patient's awareness of abnormal movements (rate only patient's report): No Awareness, Dental Status Current problems with teeth and/or dentures?: No Does patient usually wear dentures?: No  CIWA:  CIWA-Ar Total: 0 COWS:  COWS Total Score: 1  Musculoskeletal: Strength & Muscle Tone: within normal limits Gait & Station: normal Patient leans: N/A  Psychiatric Specialty Exam: Physical Exam  Constitutional: She is oriented to person, place, and time. She appears well-developed and well-nourished.  HENT:  Head: Normocephalic and atraumatic.  Eyes: EOM are normal.  Neck: Normal range of motion.  Respiratory: Effort normal.  Musculoskeletal: Normal range of motion.  Neurological: She is alert and oriented to person, place, and time.    Review of Systems  Constitutional: Negative.   HENT: Negative.   Eyes: Negative.   Respiratory: Negative.    Cardiovascular: Negative.   Gastrointestinal: Negative.   Genitourinary: Negative.   Musculoskeletal: Negative.   Skin: Negative.   Neurological: Negative.   Endo/Heme/Allergies: Negative.   Psychiatric/Behavioral: Positive for depression, substance abuse and suicidal ideas. The patient is nervous/anxious and has insomnia.     Blood pressure 116/78, pulse 67, temperature 98.1 F (36.7 C), temperature source Oral, resp. rate 18, height 5' 7.72" (1.72 m), weight 50.8 kg (112 lb), last menstrual period 07/18/2015, SpO2 100 %.Body mass index is 17.17 kg/m.  General Appearance: Disheveled  Eye Contact:  Fair  Speech:  Clear and Coherent  Volume:  Normal  Mood:  Dysphoric  Affect:  Blunt  Thought Process:  Linear and Descriptions of Associations: Intact  Orientation:  Full (Time, Place, and Person)  Thought Content:  Hallucinations: None  Suicidal Thoughts:  Yes.  without intent/plan  Homicidal Thoughts:  No  Memory:  Immediate;   Good Recent;   Good Remote;   Good  Judgement:  Fair  Insight:  Fair  Psychomotor Activity:  Decreased  Concentration:  Concentration: Fair and Attention Span: Fair  Recall:  Good  Fund of Knowledge:  Fair  Language:  Good  Akathisia:  no  Handed:    AIMS (if indicated):     Assets:  Communication Skills  ADL's:  Intact  Cognition:  WNL  Sleep:  Number of Hours: 7     Treatment Plan Summary:  Improving. Plan to discharge by tomorrow.  For major depressive disorder the patient has been started on fluoxetine 10 mg by mouth daily  Cluster B personality traits. Rule out borderline personality disorder patient reports chronic suicidality, history of self injury, multiple suicidal attempts.  For insomnia: continue trazodone 100 mg by mouth daily at bedtime  For agitation and anxiety: continue haloperidol 0.5 mg 3 times a day  For tobacco use disorder she  will be offered a nicotine patch (declined)  Benzodiazepine, opioid and cocaine use:  Per records K she has had multiple visits to the emergency department with pain complaints which leads me to suspect opiate abuse. She also has tested positive for benzodiazepines and cocaine she says she does not use cocaine regularly she believes that marijuana was laced. She does acknowledge using clonazepam from somebody else. Patient is in need of referral for outpatient substance abuse treatment.  Migraine: will write orders for ibuprofen and Tylenol when necessary   Diet regular  Precautions every 15 minute checks  Hospitalization and status continue involuntary commitment  Disposition once stable patient will refer to the homeless shelter as she says she is homeless at this point in time  Follow-up she will be referred to follow-up with [facility] for individual therapy, medication management and substance abuse treatment.  Possible d/c Tomorrow a.m.  Jimmy Footman, MD 08/19/2015, 10:52 AM

## 2015-08-19 NOTE — Plan of Care (Signed)
Problem: Health Behavior/Discharge Planning: Goal: Compliance with treatment plan for underlying cause of condition will improve Outcome: Progressing Pt attended evening group and was compliant with assessment and medications.

## 2015-08-19 NOTE — [facility] Group Notes (Signed)
[facility] LCSW Group Therapy  08/19/2015 3:04 PM  Type of Therapy:  Group Therapy  Participation Level:  Active  Participation Quality:  Appropriate, Sharing and Supportive  Affect:  Appropriate  Cognitive:  Appropriate  Insight:  Developing/Improving  Engagement in Therapy:  Engaged  Modes of Intervention:  Activity, Discussion, Education, Dance movement psychotherapist and Support  Summary of Progress/Problems: Self-responsibility/accountability- Patients discussed self responsibility/accountability  and how it impacts them. Patients were asked to define these concepts in their own words. They discussed taking ownership of their actions and the challenges they have with taking accountability for their self. CSW introduced "YOU vs. I" statements and explained how "I" statements identifies how they feel about the situation without being threatening or offensive to others and could help improve their communication with others. Examples were provided of each. They were challenged to identify changes that are needed in order to improve self responsibility/accountability. CSW provided inspirational quotes that focused on the patients taking accountability for actions both good and bad. Patients were asked to read the quotes out loud and share their thoughts with the group about their quote. Pt was active during group and was willing to share about some of her past experiences that she felt has impacted the negative actions she has participated in recently. CSW offered support and clarity on accountability. Pt was willing to read her quote out loud and had insightful comments about her quote.    Leul Narramore G. Garnette Czech MSW, LCSWA 08/19/2015, 3:07 PM

## 2015-08-19 NOTE — Progress Notes (Signed)
Pt has been pleasant and cooperative with frequent crying spells. Pt talked about events leading to hospitalization. Pt denies SI and A/V hallucinations. Pt has been active on the unit.

## 2015-08-20 DIAGNOSIS — F603 Borderline personality disorder: Secondary | ICD-10-CM

## 2015-08-20 MED ORDER — FLUOXETINE HCL 10 MG PO CAPS: 10.0000 mg | ORAL_CAPSULE | Freq: Every day | ORAL | 0 refills | Status: DC

## 2015-08-20 MED ORDER — TRAZODONE HCL 100 MG PO TABS: 100.0000 mg | ORAL_TABLET | Freq: Every day | ORAL | 0 refills | Status: DC

## 2015-08-20 NOTE — Progress Notes (Signed)
  [facility] Adult Case Management Discharge Plan :  Will you be returning to the same living situation after discharge:  Yes,  home with partner At discharge, do you have transportation home?: Yes,  friend will pick pt up. Do you have the ability to pay for your medications: Yes,  [facility]  Release of information consent forms completed and in the chart;  Patient's signature needed at discharge.  Patient to Follow up at: Follow-up Information    [facility]. Go on 08/22/2015.   Why:  Please arrive to your appointment at 7:15AM for an assessment for medication management and therapy.  Arrive as early as possible for prompt service.  Please call Unk Pinto at [phone] for questions and assistance. Contact information: [facility] [place] [address] [place] [address] Ph: [phone] Fax: [phone]          Next level of care provider has access to [facility] Link:no  Safety Planning and Suicide Prevention discussed: Yes,  SPE reviewed with pt and friend  Have you used any form of tobacco in the last 30 days? (Cigarettes, Smokeless Tobacco, Cigars, and/or Pipes): No  Has patient been referred to the Quitline?: Patient refused referral  Patient has been referred for addiction treatment: Pt. refused referral - will follow-up with [facility].  Lynden Oxford, MSW, LCSW-A 08/20/2015, 9:45 AM

## 2015-08-20 NOTE — Progress Notes (Signed)
Denies SI/HI/AVH. Attended evening group. Visible in milieu socializing with peers. Appearance disheveled. Appropriate interaction with staff and peers. Compliant with evening medications. Voices no additional concerns at this time. Safety maintained. Will continue to monitor.

## 2015-08-20 NOTE — Progress Notes (Signed)
Patient discharged home. DC instructions provided and explained. Medications reviewed. Rx given. All questions answered. Pt stable at discharge. Denies SI, HI, AVH. Belongings returned. 

## 2015-08-20 NOTE — BHH Suicide Risk Assessment (Signed)
[facility] Discharge Suicide Risk Assessment   Principal Problem: MDD (major depressive disorder), recurrent episode, severe (HCC) Discharge Diagnoses:  Patient Active Problem List   Diagnosis Date Noted  . Borderline personality disorder [F60.3] 08/20/2015  . Tobacco use disorder [F17.200] 08/17/2015  . Cannabis use disorder, severe, dependence (HCC) [F12.20] 08/17/2015  . MDD (major depressive disorder), recurrent episode, severe (HCC) [F33.2] 08/17/2015  . Cocaine use disorder, mild, abuse [F14.10] 08/17/2015  . 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 Opioid use disorder, moderate, dependence (HCC) [F11.20] 08/17/2015  . Migraines [G43.909] 08/16/2015      Psychiatric Specialty Exam: ROS  Blood pressure 120/84, pulse 70, temperature 97.8 F (36.6 C), temperature source Oral, resp. rate 18, height 5' 7.72" (1.72 m), weight 50.8 kg (112 lb), last menstrual period 07/18/2015, SpO2 100 %.Body mass index is 17.17 kg/m.                                                       Mental Status Per Nursing Assessment::   On Admission:  Suicidal ideation indicated by patient  Demographic Factors:  Caucasian, Low socioeconomic status and Unemployed  Loss Factors: Financial problems/change in socioeconomic status  Historical Factors: Prior suicide attempts and Impulsivity  Risk Reduction Factors:   Positive social support  Continued Clinical Symptoms:  Alcohol/Substance Abuse/Dependencies Personality Disorders:   Cluster B Comorbid depression More than one psychiatric diagnosis  Cognitive Features That Contribute To Risk:  None    Suicide Risk:  Minimal: No identifiable suicidal ideation.  Patients presenting with no risk factors but with morbid ruminations; may be classified as minimal risk based on the severity of the depressive symptoms      Jimmy Footman, MD 08/20/2015, 9:16 AM
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
## 2015-08-21 ENCOUNTER — Emergency Department
Admission: EM | Admit: 2015-08-21 | Discharge: 2015-08-21 | Disposition: A | Discharge status: Home / Self Care | Payer: Self-pay | Attending: Emergency Medicine | Admitting: Emergency Medicine

## 2015-08-21 ENCOUNTER — Encounter: Payer: Self-pay | Admitting: Medical Oncology

## 2015-08-21 DIAGNOSIS — R55 Syncope and collapse: Secondary | ICD-10-CM | POA: Insufficient documentation

## 2015-08-21 DIAGNOSIS — Z87891 Personal history of nicotine dependence: Secondary | ICD-10-CM | POA: Insufficient documentation

## 2015-08-21 DIAGNOSIS — Z7951 Long term (current) use of inhaled steroids: Secondary | ICD-10-CM | POA: Insufficient documentation

## 2015-08-21 LAB — CBC
HCT: 39.1 % (ref 35.0–47.0)
Hemoglobin: 13.8 g/dL (ref 12.0–16.0)
MCH: 32.5 pg (ref 26.0–34.0)
MCHC: 35.3 g/dL (ref 32.0–36.0)
MCV: 91.9 fL (ref 80.0–100.0)
PLATELETS: 323 10*3/uL (ref 150–440)
RBC: 4.25 MIL/uL (ref 3.80–5.20)
RDW: 12.7 % (ref 11.5–14.5)
WBC: 9.3 10*3/uL (ref 3.6–11.0)

## 2015-08-21 LAB — BASIC METABOLIC PANEL
ANION GAP: 3 — AB (ref 5–15)
BUN: 9 mg/dL (ref 6–20)
CALCIUM: 9.2 mg/dL (ref 8.9–10.3)
CO2: 31 mmol/L (ref 22–32)
Chloride: 103 mmol/L (ref 101–111)
Creatinine, Ser: 0.65 mg/dL (ref 0.44–1.00)
Glucose, Bld: 105 mg/dL — ABNORMAL HIGH (ref 65–99)
POTASSIUM: 4.5 mmol/L (ref 3.5–5.1)
Sodium: 137 mmol/L (ref 135–145)
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
## 2015-08-21 NOTE — Discharge Instructions (Signed)
Please seek medical attention for any high fevers, chest pain, shortness of breath, change in behavior, persistent vomiting, bloody stool or any other new or concerning symptoms.  
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
## 2015-08-29 ENCOUNTER — Ambulatory Visit: Payer: Self-pay

## 2015-10-02 ENCOUNTER — Emergency Department
Admission: EM | Admit: 2015-10-02 | Discharge: 2015-10-02 | Disposition: A | Discharge status: Home / Self Care | Payer: No Typology Code available for payment source | Attending: Emergency Medicine | Admitting: Emergency Medicine

## 2015-10-02 DIAGNOSIS — M5432 Sciatica, left side: Secondary | ICD-10-CM

## 2015-10-02 DIAGNOSIS — M5442 Lumbago with sciatica, left side: Secondary | ICD-10-CM | POA: Diagnosis not present

## 2015-10-02 DIAGNOSIS — S39012A Strain of muscle, fascia and tendon of lower back, initial encounter: Secondary | ICD-10-CM | POA: Insufficient documentation

## 2015-10-02 DIAGNOSIS — Z87891 Personal history of nicotine dependence: Secondary | ICD-10-CM | POA: Diagnosis not present

## 2015-10-02 DIAGNOSIS — Y92410 Unspecified street and highway as the place of occurrence of the external cause: Secondary | ICD-10-CM | POA: Insufficient documentation

## 2015-10-02 DIAGNOSIS — S3992XA Unspecified injury of lower back, initial encounter: Secondary | ICD-10-CM | POA: Diagnosis present

## 2015-10-02 DIAGNOSIS — Y939 Activity, unspecified: Secondary | ICD-10-CM | POA: Diagnosis not present

## 2015-10-02 DIAGNOSIS — Z79899 Other long term (current) drug therapy: Secondary | ICD-10-CM | POA: Diagnosis not present

## 2015-10-02 DIAGNOSIS — Y999 Unspecified external cause status: Secondary | ICD-10-CM | POA: Diagnosis not present

## 2015-10-02 MED ORDER — CYCLOBENZAPRINE HCL 5 MG PO TABS: 5.0000 mg | ORAL_TABLET | Freq: Three times a day (TID) | ORAL | 0 refills | Status: DC | PRN
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
## 2015-10-02 NOTE — Discharge Instructions (Signed)
Take the prescription meds as directed. Follow-up with your chiropractor for continued management. Take OTC ibuprofen (Motrin/Advil) 600-800 mg, with food, 3 times a day.
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
## 2015-10-24 ENCOUNTER — Ambulatory Visit: Payer: Self-pay | Admitting: Pharmacy Technician

## 2015-10-24 DIAGNOSIS — Z79899 Other long term (current) drug therapy: Secondary | ICD-10-CM

## 2015-10-24 NOTE — Progress Notes (Signed)
Met with patient completed financial assistance application for [place] due to recent ED visit.  Patient agreed to be responsible for gathering financial information and forwarding to appropriate department in [facility].    Completed [facility] application and contract.  Patient agreed to all terms of the [facility] contract.  Patient to provide notarized letter of support and 2016 taxes.  Provided patient with community resource material based on her particular needs.    [place] Prescription Application completed with patient.  Forwarded to Dr. Leonides Schanz for signature.  Upon receipt of signed application from provider and proof of income from patient, [place] Prescription Application will be submitted to [facility].  [facility]

## 2015-11-27 ENCOUNTER — Ambulatory Visit: Payer: Self-pay

## 2016-02-09 ENCOUNTER — Emergency Department: Payer: Self-pay

## 2016-02-09 ENCOUNTER — Emergency Department
Admission: EM | Admit: 2016-02-09 | Discharge: 2016-02-09 | Disposition: A | Discharge status: Home / Self Care | Payer: Self-pay | Attending: Emergency Medicine | Admitting: Emergency Medicine

## 2016-02-09 DIAGNOSIS — Z87891 Personal history of nicotine dependence: Secondary | ICD-10-CM | POA: Insufficient documentation

## 2016-02-09 DIAGNOSIS — J019 Acute sinusitis, unspecified: Secondary | ICD-10-CM | POA: Insufficient documentation

## 2016-02-09 DIAGNOSIS — Z79899 Other long term (current) drug therapy: Secondary | ICD-10-CM | POA: Insufficient documentation

## 2016-02-09 MED ORDER — KETOROLAC TROMETHAMINE 60 MG/2ML IM SOLN
60.0000 mg | Freq: Once | INTRAMUSCULAR | Status: AC
  Administered 2016-02-09: 60 mg via INTRAMUSCULAR
  Filled 2016-02-09: qty 2

## 2016-02-09 MED ORDER — IPRATROPIUM-ALBUTEROL 0.5-2.5 (3) MG/3ML IN SOLN
3.0000 mL | Freq: Once | RESPIRATORY_TRACT | Status: AC
  Administered 2016-02-09: 3 mL via RESPIRATORY_TRACT
  Filled 2016-02-09: qty 3

## 2016-02-09 MED ORDER — FLUTICASONE PROPIONATE 50 MCG/ACT NA SUSP: 2.0000 | Freq: Every day | NASAL | 0 refills | Status: DC

## 2016-02-09 MED ORDER — AMOXICILLIN-POT CLAVULANATE 875-125 MG PO TABS: 1.0000 | ORAL_TABLET | Freq: Two times a day (BID) | ORAL | 0 refills | Status: AC

## 2016-02-09 MED ORDER — MELOXICAM 15 MG PO TABS: 15.0000 mg | ORAL_TABLET | Freq: Every day | ORAL | 0 refills | Status: AC

## 2016-02-09 NOTE — ED Notes (Signed)
Pt reports ear pain x 3D, but has been sick for about 8-9 days with chest congestion, non-productive cough, nasal drainage, sore throat, subjective fevers and body aches. Pt states she had a fever 5 days ago 102.5. Pt has been using OTC products but no relief.  Hx of ear infections as a child.  Pt states she has had sick contacts and did not receive a flu shot.

## 2016-02-09 NOTE — ED Provider Notes (Signed)
[facility] Emergency Department Provider Note  ____________________________________________  Time seen: Approximately 1:56 PM  I have reviewed the triage vital signs and the nursing notes.   HISTORY  Chief Complaint Otalgia    HPI Kathy Henry is a 26 y.o. female presents to emergency department with congestion, nonproductive cough, runny nose, sore throat, muscle aches, chills for 8 days. Patient states that for the last 3 days her years have been congested and has pain in both ears. She is to having difficulty hearing out of both ears. Patient has been taking over-the-counter cough and pain medicines but they have not helped.Patient states that albuterol inhalers have helped in the past. She denies fever, headache, difficulty breathing, chest pain, nausea, vomiting, abdominal pain.   Past Medical History:  Diagnosis Date  . Anxiety   . Bipolar 1 disorder (HCC)   . Depression   . OCD (obsessive compulsive disorder)     Patient Active Problem List   Diagnosis Date Noted  . Borderline personality disorder 08/20/2015  . Tobacco use disorder 08/17/2015  . Cannabis use disorder, severe, dependence (HCC) 08/17/2015  . MDD (major depressive disorder), recurrent episode, severe (HCC) 08/17/2015  . Cocaine use disorder, mild, abuse 08/17/2015  . 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 Opioid use disorder, moderate, dependence (HCC) 08/17/2015  . Migraines 08/16/2015    Past Surgical History:  Procedure Laterality Date  . FRACTURE SURGERY      Prior to Admission medications   Medication Sig Start Date End Date Taking? Authorizing Provider  albuterol (PROVENTIL HFA;VENTOLIN HFA) 108 (90 BASE) MCG/ACT inhaler Inhale 2 puffs into the lungs every 6 (six) hours as needed for wheezing or shortness of breath. 06/27/14   Minna AntisKevin Paduchowski, MD  amoxicillin-clavulanate (AUGMENTIN) 875-125 MG tablet Take 1 tablet by mouth 2 (two) times daily.  02/09/16 02/19/16  Enid DerryAshley Kiondra Caicedo, PA-C  cyclobenzaprine (FLEXERIL) 5 MG tablet Take 1 tablet (5 mg total) by mouth 3 (three) times daily as needed for muscle spasms. 10/02/15   Jenise V Bacon Menshew, PA-C  FLUoxetine (PROZAC) 10 MG capsule Take 1 capsule (10 mg total) by mouth daily. 08/20/15   Jimmy FootmanAndrea Hernandez-Gonzalez, MD  fluticasone (FLONASE) 50 MCG/ACT nasal spray Place 2 sprays into both nostrils daily. 02/09/16 02/08/17  Enid DerryAshley Magdalyn Arenivas, PA-C  meloxicam (MOBIC) 15 MG tablet Take 1 tablet (15 mg total) by mouth daily. 02/09/16 02/19/16  Enid DerryAshley Edilberto Roosevelt, PA-C  traZODone (DESYREL) 100 MG tablet Take 1 tablet (100 mg total) by mouth at bedtime. 08/20/15   Jimmy FootmanAndrea Hernandez-Gonzalez, MD    Allergies Patient has no known allergies.  No family history on file.  Social History Social History  Substance Use Topics  . Smoking status: Former Smoker    Packs/day: 0.50    Years: 5.00    Quit date: 07/18/2015  . Smokeless tobacco: Never Used  . Alcohol use No     Review of Systems  Constitutional: No fever Eyes: No visual changes. No discharge. ENT: Positive for congestion and rhinorrhea. Cardiovascular: No chest pain. Respiratory: Positive for cough. No SOB. Gastrointestinal: No abdominal pain.  No nausea, no vomiting.  No diarrhea.  No constipation. Musculoskeletal: Negative for musculoskeletal pain. Skin: Negative for rash, abrasions, lacerations, ecchymosis. Neurological: Negative for headaches.   ____________________________________________   PHYSICAL EXAM:  VITAL SIGNS: ED Triage Vitals [02/09/16 1140]  Enc Vitals Group     BP 132/72     Pulse Rate 76     Resp 18  Temp 98.3 F (36.8 C)     Temp Source Oral     SpO2 97 %     Weight 122 lb (55.3 kg)     Height 5\' 8"  (1.727 m)     Head Circumference      Peak Flow      Pain Score 9     Pain Loc      Pain Edu?      Excl. in GC?      Constitutional: Alert and oriented. Well appearing and in no acute distress. Eyes:  Conjunctivae are normal. PERRL. EOMI. No discharge. Head: Atraumatic. ENT: No frontal and maxillary sinus tenderness.      Ears: Tympanic membranes pearly gray with good landmarks. No discharge.      Nose: Mild congestion/rhinnorhea.      Mouth/Throat: Mucous membranes are moist. Oropharynx non-erythematous. Tonsils not enlarged. No exudates. Uvula midline. Neck: No stridor.   Hematological/Lymphatic/Immunilogical: No cervical lymphadenopathy. Cardiovascular: Normal rate, regular rhythm. Normal S1 and S2.  Good peripheral circulation. Respiratory: Normal respiratory effort without tachypnea or retractions. Lungs CTAB. Good air entry to the bases with no decreased or absent breath sounds. Gastrointestinal: Bowel sounds 4 quadrants. Soft and nontender to palpation. No guarding or rigidity. No palpable masses. No distention. Musculoskeletal: Full range of motion to all extremities. No gross deformities appreciated. Neurologic:  Normal speech and language. No gross focal neurologic deficits are appreciated.  Skin:  Skin is warm, dry and intact. No rash noted. Psychiatric: Mood and affect are normal. Speech and behavior are normal. Patient exhibits appropriate insight and judgement.   ____________________________________________   LABS (all labs ordered are listed, but only abnormal results are displayed)  Labs Reviewed - No data to display ____________________________________________  EKG   ____________________________________________  RADIOLOGY Lexine Baton, personally viewed and evaluated these images (plain radiographs) as part of my medical decision making, as well as reviewing the written report by the radiologist.  Dg Chest 2 View  Result Date: 02/09/2016 CLINICAL DATA:  Chest congestion, cough, nasal drainage and sore throat. Fever and body aches. EXAM: CHEST  2 VIEW COMPARISON:  07/25/2015 FINDINGS: The heart size and mediastinal contours are within normal limits. There is  no evidence of pulmonary edema, consolidation, pneumothorax, nodule or pleural fluid. The visualized skeletal structures are unremarkable. IMPRESSION: No active cardiopulmonary disease. Electronically Signed   By: Irish Lack M.D.   On: 02/09/2016 12:34    ____________________________________________    PROCEDURES  Procedure(s) performed:    Procedures    Medications  ipratropium-albuterol (DUONEB) 0.5-2.5 (3) MG/3ML nebulizer solution 3 mL (3 mLs Nebulization Given 02/09/16 1227)  ketorolac (TORADOL) injection 60 mg (60 mg Intramuscular Given 02/09/16 1227)     ____________________________________________   INITIAL IMPRESSION / ASSESSMENT AND PLAN / ED COURSE  Pertinent labs & imaging results that were available during my care of the patient were reviewed by me and considered in my medical decision making (see chart for details).  Review of the [place] CSRS was performed in accordance of the [facility] prior to dispensing any controlled drugs.     Patient's diagnosis is consistent with sinusitis. Patient will be discharged home with prescriptions for Augmentin, Flonase, meloxicam. Vital signs and exam are reassuring. X-ray negative for any acute cardiopulmonary processes. Patient is to follow up with ENT if ear pain and hearing do not improve. Patient is to follow up with PCP as needed or otherwise directed. Patient is given ED precautions to return to the ED for  any worsening or new symptoms.     ____________________________________________  FINAL CLINICAL IMPRESSION(S) / ED DIAGNOSES  Final diagnoses:  Acute non-recurrent sinusitis, unspecified location      NEW MEDICATIONS STARTED DURING THIS VISIT:  Discharge Medication List as of 02/09/2016  1:59 PM    START taking these medications   Details  amoxicillin-clavulanate (AUGMENTIN) 875-125 MG tablet Take 1 tablet by mouth 2 (two) times daily., Starting Sat 02/09/2016, Until Tue 02/19/2016, Print    fluticasone  (FLONASE) 50 MCG/ACT nasal spray Place 2 sprays into both nostrils daily., Starting Sat 02/09/2016, Until Sun 02/08/2017, Print    meloxicam (MOBIC) 15 MG tablet Take 1 tablet (15 mg total) by mouth daily., Starting Sat 02/09/2016, Until Tue 02/19/2016, Print            This chart was dictated using voice recognition software/Dragon. Despite best efforts to proofread, errors can occur which can change the meaning. Any change was purely unintentional.    Enid Derry, PA-C 02/09/16 1606    Nita Sickle, MD 02/12/16 [phone]

## 2016-02-09 NOTE — ED Triage Notes (Signed)
Earache X 9 days. Pt alert and oriented X4, active, cooperative, pt in NAD. RR even and unlabored, color WNL.

## 2016-02-16 ENCOUNTER — Encounter: Payer: Self-pay | Admitting: Emergency Medicine

## 2016-02-16 ENCOUNTER — Emergency Department
Admission: EM | Admit: 2016-02-16 | Discharge: 2016-02-16 | Disposition: A | Discharge status: Home / Self Care | Payer: Self-pay | Attending: Emergency Medicine | Admitting: Emergency Medicine

## 2016-02-16 DIAGNOSIS — H6992 Unspecified Eustachian tube disorder, left ear: Secondary | ICD-10-CM

## 2016-02-16 DIAGNOSIS — Z87891 Personal history of nicotine dependence: Secondary | ICD-10-CM | POA: Insufficient documentation

## 2016-02-16 DIAGNOSIS — Z79899 Other long term (current) drug therapy: Secondary | ICD-10-CM | POA: Insufficient documentation

## 2016-02-16 DIAGNOSIS — H6983 Other specified disorders of Eustachian tube, bilateral: Secondary | ICD-10-CM | POA: Insufficient documentation

## 2016-02-16 MED ORDER — HYDROMORPHONE HCL 1 MG/ML IJ SOLN
1.0000 mg | Freq: Once | INTRAMUSCULAR | Status: AC
  Administered 2016-02-16: 1 mg via INTRAMUSCULAR
  Filled 2016-02-16: qty 1

## 2016-02-16 MED ORDER — FEXOFENADINE-PSEUDOEPHED ER 60-120 MG PO TB12: 1.0000 | ORAL_TABLET | Freq: Two times a day (BID) | ORAL | 0 refills | Status: DC

## 2016-02-16 MED ORDER — TRAMADOL HCL 50 MG PO TABS: 50.0000 mg | ORAL_TABLET | Freq: Four times a day (QID) | ORAL | 0 refills | Status: DC | PRN

## 2016-02-16 NOTE — ED Triage Notes (Signed)
Reports bilateral earache.  Seen here last week and started amoxicillin and seemed to be getting better but pain started back again today.

## 2016-02-16 NOTE — Discharge Instructions (Signed)
Advised follow-up with [facility] for definitive evaluation and treatment.

## 2016-02-16 NOTE — ED Notes (Signed)
Pt verbalized understanding of discharge instructions. NAD at this time. 

## 2016-02-16 NOTE — ED Provider Notes (Signed)
[facility] Emergency Department Provider Note   ____________________________________________   First MD Initiated Contact with Patient 02/16/16 1457     (approximate)  I have reviewed the triage vital signs and the nursing notes.   HISTORY  Chief Complaint Ear Pain    HPI Kathy Henry is a 26 y.o. female patient complaining of bilateral earache. Patient left is worse than the right. Patient states she was seen last week and stopped amoxicillin. Patient stated no improvement. Patient reports mild hearing loss was started last night.Patient also continued reports nasal congestion intermittently rhinorrhea. Patient can't rate the pain as a 9/10. Patient described a pain as "achy". Patient is to follow with ENT clinic as directed last visit.   Past Medical History:  Diagnosis Date  . Anxiety   . Bipolar 1 disorder (HCC)   . Depression   . OCD (obsessive compulsive disorder)     Patient Active Problem List   Diagnosis Date Noted  . Borderline personality disorder 08/20/2015  . Tobacco use disorder 08/17/2015  . Cannabis use disorder, severe, dependence (HCC) 08/17/2015  . MDD (major depressive disorder), recurrent episode, severe (HCC) 08/17/2015  . Cocaine use disorder, mild, abuse 08/17/2015  . 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 Opioid use disorder, moderate, dependence (HCC) 08/17/2015  . Migraines 08/16/2015    Past Surgical History:  Procedure Laterality Date  . FRACTURE SURGERY      Prior to Admission medications   Medication Sig Start Date End Date Taking? Authorizing Provider  albuterol (PROVENTIL HFA;VENTOLIN HFA) 108 (90 BASE) MCG/ACT inhaler Inhale 2 puffs into the lungs every 6 (six) hours as needed for wheezing or shortness of breath. 06/27/14   Minna Antis, MD  amoxicillin-clavulanate (AUGMENTIN) 875-125 MG tablet Take 1 tablet by mouth 2 (two) times daily. 02/09/16 02/19/16  Enid Derry, PA-C    cyclobenzaprine (FLEXERIL) 5 MG tablet Take 1 tablet (5 mg total) by mouth 3 (three) times daily as needed for muscle spasms. 10/02/15   Jenise V Bacon Menshew, PA-C  fexofenadine-pseudoephedrine (ALLEGRA-D) 60-120 MG 12 hr tablet Take 1 tablet by mouth 2 (two) times daily. 02/16/16   Joni Reining, PA-C  FLUoxetine (PROZAC) 10 MG capsule Take 1 capsule (10 mg total) by mouth daily. 08/20/15   Jimmy Footman, MD  fluticasone (FLONASE) 50 MCG/ACT nasal spray Place 2 sprays into both nostrils daily. 02/09/16 02/08/17  Enid Derry, PA-C  meloxicam (MOBIC) 15 MG tablet Take 1 tablet (15 mg total) by mouth daily. 02/09/16 02/19/16  Enid Derry, PA-C  traMADol (ULTRAM) 50 MG tablet Take 1 tablet (50 mg total) by mouth every 6 (six) hours as needed for moderate pain. 02/16/16   Joni Reining, PA-C  traZODone (DESYREL) 100 MG tablet Take 1 tablet (100 mg total) by mouth at bedtime. 08/20/15   Jimmy Footman, MD    Allergies Patient has no known allergies.  No family history on file.  Social History Social History  Substance Use Topics  . Smoking status: Former Smoker    Packs/day: 0.50    Years: 5.00    Quit date: 07/18/2015  . Smokeless tobacco: Never Used  . Alcohol use No    Review of Systems Constitutional: No fever/chills Eyes: No visual changes. ENT: No sore throat.Bilateral ear pain with nasal congestion. Cardiovascular: Denies chest pain. Respiratory: Denies shortness of breath. Gastrointestinal: No abdominal pain.  No nausea, no vomiting.  No diarrhea.  No constipation. Genitourinary: Negative for dysuria. Musculoskeletal: Negative for  back pain. Skin: Negative for rash. Neurological: Negative for headaches, focal weakness or numbness. Psychiatric:Borderline personality disorder. History of opiate dependency. Depression and anxiety.     ____________________________________________   PHYSICAL EXAM:  VITAL SIGNS: ED Triage Vitals  Enc Vitals Group      BP 02/16/16 1414 110/79     Pulse Rate 02/16/16 1414 84     Resp 02/16/16 1414 20     Temp 02/16/16 1414 98.3 F (36.8 C)     Temp Source 02/16/16 1414 Oral     SpO2 02/16/16 1414 100 %     Weight 02/16/16 1414 122 lb (55.3 kg)     Height 02/16/16 1414 5\' 8"  (1.727 m)     Head Circumference --      Peak Flow --      Pain Score 02/16/16 1415 9     Pain Loc --      Pain Edu? --      Excl. in GC? --     Constitutional: Alert and oriented. Well appearing and in no acute distress. Eyes: Conjunctivae are normal. PERRL. EOMI. Head: Atraumatic. Nose: Bilateral maxillary guarding. Edematous nasal turbinates. EARS: Bilateral edematous and on erythematous TM Mouth/Throat: Mucous membranes are moist.  Oropharynx non-erythematous. Neck: No stridor.  No cervical spine tenderness to palpation. Hematological/Lymphatic/Immunilogical: No cervical lymphadenopathy. Cardiovascular: Normal rate, regular rhythm. Grossly normal heart sounds.  Good peripheral circulation. Respiratory: Normal respiratory effort.  No retractions. Lungs CTAB. Gastrointestinal: Soft and nontender. No distention. No abdominal bruits. No CVA tenderness. Musculoskeletal: No lower extremity tenderness nor edema.  No joint effusions. Neurologic:  Normal speech and language. No gross focal neurologic deficits are appreciated. No gait instability. Skin:  Skin is warm, dry and intact. No rash noted. Psychiatric: Mood and affect are normal. Speech and behavior are normal.  ____________________________________________   LABS (all labs ordered are listed, but only abnormal results are displayed)  Labs Reviewed - No data to display ____________________________________________  EKG   ____________________________________________  RADIOLOGY   ____________________________________________   PROCEDURES  Procedure(s) performed: None  Procedures  Critical Care performed:  No  ____________________________________________   INITIAL IMPRESSION / ASSESSMENT AND PLAN / ED COURSE  Pertinent labs & imaging results that were available during my care of the patient were reviewed by me and considered in my medical decision making (see chart for details).  Bilateral eustachian tube disorder. Patient advised to continue antibiotics. Patient given a prescription for fexofenadine pseudoephedrine. Patient also given prescription for 3 days of tramadol. Patient advised to follow-up by calling for an appointment with the ENT clinic for definitive evaluation and treatment.      ____________________________________________   FINAL CLINICAL IMPRESSION(S) / ED DIAGNOSES  Final diagnoses:  Eustachian tube disorder, left      NEW MEDICATIONS STARTED DURING THIS VISIT:  New Prescriptions   FEXOFENADINE-PSEUDOEPHEDRINE (ALLEGRA-D) 60-120 MG 12 HR TABLET    Take 1 tablet by mouth 2 (two) times daily.   TRAMADOL (ULTRAM) 50 MG TABLET    Take 1 tablet (50 mg total) by mouth every 6 (six) hours as needed for moderate pain.     Note:  This document was prepared using Dragon voice recognition software and may include unintentional dictation errors.    Joni Reiningonald K Smith, PA-C 02/16/16 1504    Minna AntisKevin Paduchowski, MD 02/16/16 [phone]

## 2016-02-21 ENCOUNTER — Telehealth: Payer: Self-pay | Admitting: Pharmacy Technician

## 2016-02-21 NOTE — Telephone Encounter (Signed)
Patient stated that [facility].  Patient stated that she has stopped taking the Latuda because it causes her legs to become restless.  Patient is to [facility] to make aware and discuss an alternate medication.  Sherilyn DacostaBetty J. Henry Care Manager [facility]

## 2016-03-21 ENCOUNTER — Emergency Department
Admission: EM | Admit: 2016-03-21 | Discharge: 2016-03-21 | Disposition: A | Discharge status: Home / Self Care | Payer: Self-pay | Attending: Emergency Medicine | Admitting: Emergency Medicine

## 2016-03-21 ENCOUNTER — Encounter: Payer: Self-pay | Admitting: Emergency Medicine

## 2016-03-21 ENCOUNTER — Emergency Department: Payer: Self-pay

## 2016-03-21 DIAGNOSIS — R519 Headache, unspecified: Secondary | ICD-10-CM

## 2016-03-21 DIAGNOSIS — F1410 Cocaine abuse, uncomplicated: Secondary | ICD-10-CM | POA: Insufficient documentation

## 2016-03-21 DIAGNOSIS — R51 Headache: Secondary | ICD-10-CM | POA: Insufficient documentation

## 2016-03-21 DIAGNOSIS — R112 Nausea with vomiting, unspecified: Secondary | ICD-10-CM | POA: Insufficient documentation

## 2016-03-21 DIAGNOSIS — Z87891 Personal history of nicotine dependence: Secondary | ICD-10-CM | POA: Insufficient documentation

## 2016-03-21 DIAGNOSIS — F1290 Cannabis use, unspecified, uncomplicated: Secondary | ICD-10-CM | POA: Insufficient documentation

## 2016-03-21 DIAGNOSIS — Z79899 Other long term (current) drug therapy: Secondary | ICD-10-CM | POA: Insufficient documentation

## 2016-03-21 LAB — CBC WITH DIFFERENTIAL/PLATELET
BASOS ABS: 0.1 10*3/uL (ref 0–0.1)
BASOS PCT: 1 %
EOS ABS: 0.5 10*3/uL (ref 0–0.7)
Eosinophils Relative: 7 %
HEMATOCRIT: 38.2 % (ref 35.0–47.0)
Hemoglobin: 13.5 g/dL (ref 12.0–16.0)
Lymphocytes Relative: 28 %
Lymphs Abs: 2.1 10*3/uL (ref 1.0–3.6)
MCH: 31.9 pg (ref 26.0–34.0)
MCHC: 35.3 g/dL (ref 32.0–36.0)
MCV: 90.6 fL (ref 80.0–100.0)
MONO ABS: 0.6 10*3/uL (ref 0.2–0.9)
MONOS PCT: 9 %
NEUTROS ABS: 4.2 10*3/uL (ref 1.4–6.5)
Neutrophils Relative %: 55 %
PLATELETS: 294 10*3/uL (ref 150–440)
RBC: 4.22 MIL/uL (ref 3.80–5.20)
RDW: 13.7 % (ref 11.5–14.5)
WBC: 7.4 10*3/uL (ref 3.6–11.0)

## 2016-03-21 LAB — URINALYSIS, COMPLETE (UACMP) WITH MICROSCOPIC
BACTERIA UA: NONE SEEN
BILIRUBIN URINE: NEGATIVE
GLUCOSE, UA: NEGATIVE mg/dL
Hgb urine dipstick: NEGATIVE
Ketones, ur: NEGATIVE mg/dL
Leukocytes, UA: NEGATIVE
NITRITE: NEGATIVE
Protein, ur: NEGATIVE mg/dL
RBC / HPF: NONE SEEN RBC/hpf (ref 0–5)
SPECIFIC GRAVITY, URINE: 1.001 — AB (ref 1.005–1.030)
pH: 6 (ref 5.0–8.0)

## 2016-03-21 LAB — URINE DRUG SCREEN, QUALITATIVE (ARMC ONLY)
AMPHETAMINES, UR SCREEN: NOT DETECTED
BENZODIAZEPINE, UR SCRN: NOT DETECTED
Barbiturates, Ur Screen: NOT DETECTED
CANNABINOID 50 NG, UR ~~LOC~~: POSITIVE — AB
Cocaine Metabolite,Ur ~~LOC~~: POSITIVE — AB
MDMA (Ecstasy)Ur Screen: NOT DETECTED
METHADONE SCREEN, URINE: NOT DETECTED
Opiate, Ur Screen: NOT DETECTED
Phencyclidine (PCP) Ur S: NOT DETECTED
Tricyclic, Ur Screen: NOT DETECTED

## 2016-03-21 LAB — POCT PREGNANCY, URINE: PREG TEST UR: NEGATIVE

## 2016-03-21 MED ORDER — DIPHENHYDRAMINE HCL 50 MG/ML IJ SOLN
12.5000 mg | Freq: Once | INTRAMUSCULAR | Status: AC
Start: 2016-03-21 — End: 2016-03-21
  Administered 2016-03-21: 12.5 mg via INTRAVENOUS
  Filled 2016-03-21: qty 1

## 2016-03-21 MED ORDER — KETOROLAC TROMETHAMINE 30 MG/ML IJ SOLN
30.0000 mg | Freq: Once | INTRAMUSCULAR | Status: AC
  Administered 2016-03-21: 30 mg via INTRAVENOUS
  Filled 2016-03-21: qty 1

## 2016-03-21 MED ORDER — METOCLOPRAMIDE HCL 5 MG/ML IJ SOLN
10.0000 mg | Freq: Once | INTRAMUSCULAR | Status: AC
  Administered 2016-03-21: 10 mg via INTRAVENOUS
  Filled 2016-03-21: qty 2

## 2016-03-21 MED ORDER — SODIUM CHLORIDE 0.9 % IV BOLUS (SEPSIS)
1000.0000 mL | Freq: Once | INTRAVENOUS | Status: AC
  Administered 2016-03-21: 1000 mL via INTRAVENOUS

## 2016-03-21 MED ORDER — PROMETHAZINE HCL 12.5 MG PO TABS: 12.5000 mg | ORAL_TABLET | Freq: Four times a day (QID) | ORAL | 0 refills | Status: DC | PRN

## 2016-03-21 MED ORDER — KETOROLAC TROMETHAMINE 10 MG PO TABS: 10.0000 mg | ORAL_TABLET | Freq: Four times a day (QID) | ORAL | 0 refills | Status: DC | PRN

## 2016-03-21 NOTE — ED Provider Notes (Signed)
[facility] Emergency Department Provider Note ____________________________________________  Time seen: Approximately 9:23 AM  I have reviewed the triage vital signs and the nursing notes.   HISTORY  Chief Complaint Headache   HPI Kathy Henry is a 26 y.o. female who presents to the emergency department for evaluation of headache. Symptoms started 3 days ago with some associated dizziness, near syncope, and vomiting. She states that she had television last night while at work and had to sit down before she passed out. She is unable to tell if the vomiting is related to headache or if she has a headache because she has been vomiting. She has a history of substance abuse, but denies recent use and denies withdrawal symptoms.  Location: Right frontal Similar to previous headaches: No Duration: Constant TIMING: 3 days SEVERITY: 9 out of 10 QUALITY: Throbbing CONTEXT: No known exposures MODIFYING FACTORS: No improvement with Tylenol or ibuprofen ASSOCIATED SYMPTOMS: Vomiting Past Medical History:  Diagnosis Date  . Anxiety   . Bipolar 1 disorder (HCC)   . Depression   . OCD (obsessive compulsive disorder)     Patient Active Problem List   Diagnosis Date Noted  . Borderline personality disorder 08/20/2015  . Tobacco use disorder 08/17/2015  . Cannabis use disorder, severe, dependence (HCC) 08/17/2015  . MDD (major depressive disorder), recurrent episode, severe (HCC) 08/17/2015  . Cocaine use disorder, mild, abuse 08/17/2015  . 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 Opioid use disorder, moderate, dependence (HCC) 08/17/2015  . Migraines 08/16/2015    Past Surgical History:  Procedure Laterality Date  . FRACTURE SURGERY      Prior to Admission medications   Medication Sig Start Date End Date Taking? Authorizing Provider  albuterol (PROVENTIL HFA;VENTOLIN HFA) 108 (90 BASE) MCG/ACT inhaler Inhale 2 puffs into the lungs  every 6 (six) hours as needed for wheezing or shortness of breath. 06/27/14   Minna Antis, MD  cyclobenzaprine (FLEXERIL) 5 MG tablet Take 1 tablet (5 mg total) by mouth 3 (three) times daily as needed for muscle spasms. 10/02/15   Jenise V Bacon Menshew, PA-C  fexofenadine-pseudoephedrine (ALLEGRA-D) 60-120 MG 12 hr tablet Take 1 tablet by mouth 2 (two) times daily. 02/16/16   Joni Reining, PA-C  FLUoxetine (PROZAC) 10 MG capsule Take 1 capsule (10 mg total) by mouth daily. 08/20/15   Jimmy Footman, MD  fluticasone (FLONASE) 50 MCG/ACT nasal spray Place 2 sprays into both nostrils daily. 02/09/16 02/08/17  Enid Derry, PA-C  ketorolac (TORADOL) 10 MG tablet Take 1 tablet (10 mg total) by mouth every 6 (six) hours as needed. 03/21/16   [name], FNP  promethazine (PHENERGAN) 12.5 MG tablet Take 1 tablet (12.5 mg total) by mouth every 6 (six) hours as needed for nausea or vomiting. 03/21/16   [name], FNP  traMADol (ULTRAM) 50 MG tablet Take 1 tablet (50 mg total) by mouth every 6 (six) hours as needed for moderate pain. 02/16/16   Joni Reining, PA-C  traZODone (DESYREL) 100 MG tablet Take 1 tablet (100 mg total) by mouth at bedtime. 08/20/15   Jimmy Footman, MD    Allergies Patient has no known allergies.  No family history on file.  Social History Social History  Substance Use Topics  . Smoking status: Former Smoker    Packs/day: 0.50    Years: 5.00    Quit date: 07/18/2015  . Smokeless tobacco: Never Used  . Alcohol use No    Review  of Systems Constitutional: No fever/chills or recent injury. Eyes: No visual changes. ENT: No sore throat. Respiratory: Denies shortness of breath. Gastrointestinal: No abdominal pain.  No nausea, no vomiting.  No diarrhea.  No constipation. Musculoskeletal: Negative for pain. Skin: Negative for rash. Neurological:Positive for headache, Negative for focal weakness or numbness. No confusion.  Positive for near  syncope last p.m. ___________________________________________   PHYSICAL EXAM:  VITAL SIGNS: ED Triage Vitals  Enc Vitals Group     BP 03/21/16 0903 128/72     Pulse Rate 03/21/16 0903 79     Resp 03/21/16 0903 20     Temp 03/21/16 0903 97.7 F (36.5 C)     Temp Source 03/21/16 0903 Oral     SpO2 03/21/16 0903 100 %     Weight 03/21/16 0904 122 lb (55.3 kg)     Height --      Head Circumference --      Peak Flow --      Pain Score 03/21/16 0904 10     Pain Loc --      Pain Edu? --      Excl. in GC? --     Constitutional: Alert and oriented. Well appearing and in no acute distress. Eyes: Conjunctivae are normal. PERRL. EOMI. No evidence of papilledema on limited exam. Head: Atraumatic. Nose: No congestion/rhinnorhea. Mouth/Throat: Mucous membranes are moist.  Oropharynx non-erythematous. Neck: No stridor. No meningismus.   Cardiovascular: Normal rate, regular rhythm. Grossly normal heart sounds.  Good peripheral circulation. Respiratory: Normal respiratory effort.  No retractions. Lungs CTAB. Gastrointestinal: Abdomen is soft, nontender, with no rebound or guarding. Bowel sounds are hyperactive active and present 4 quadrants.  Musculoskeletal: No lower extremity tenderness nor edema.  No joint effusions. Neurologic:  Normal speech and language. No gross focal neurologic deficits are appreciated. No gait instability. Cranial nerves: 2-10 normal as tested. Cerebellar:Normal Romberg, finger-nose-finger, normal gait. Sensorimotor: No aphasia, pronator drift, clonus, sensory loss or abnormal reflexes.  Skin:  Skin is warm, dry and intact. No rash noted. Psychiatric: Mood and affect are normal. Speech and behavior are normal. Normal thought process and cognition.  ____________________________________________   LABS (all labs ordered are listed, but only abnormal results are displayed)  Labs Reviewed  URINALYSIS, COMPLETE (UACMP) WITH MICROSCOPIC - Abnormal; Notable for the  following:       Result Value   Color, Urine STRAW (*)    APPearance CLEAR (*)    Specific Gravity, Urine 1.001 (*)    Squamous Epithelial / LPF 0-5 (*)    All other components within normal limits  URINE DRUG SCREEN, QUALITATIVE ([facility] ONLY) - Abnormal; Notable for the following:    Cocaine Metabolite,Ur [place] POSITIVE (*)    Cannabinoid 50 Ng, Ur [place] POSITIVE (*)    All other components within normal limits  CBC WITH DIFFERENTIAL/PLATELET  POC URINE PREG, ED  POCT PREGNANCY, URINE   ____________________________________________  EKG   ____________________________________________  RADIOLOGY  CT of the head without contrast does not show any acute findings per radiology. ____________________________________________   PROCEDURES  Procedure(s) performed: None  Critical Care performed: No  ____________________________________________   INITIAL IMPRESSION / ASSESSMENT AND PLAN / ED COURSE  26 year old female presenting to the emergency department for a 3 day history of headache and vomiting. Headache is not typical for her and states that she can usually just takes some ibuprofen or Tylenol and the headache is away. She specifically denies recent substance abuse or any head injury. We will  obtain labs, urine, and get a CT head without contrast.  ----------------------------------------- 12:16 PM on 03/21/2016 -----------------------------------------  Headache nearly resolved. Awaiting CT results. Negative lab results were reviewed with the patient. Plan to discharge her home as long as CT is negative.  12:56 PM on 03-21-2016  Patient discharged home with prescriptions for Phenergan and Toradol. Urinalysis noted to be positive for cocaine and marijuana. She does not wish to seek substance abuse counseling at this time. Patient was advised to follow-up with the primary care provider for choice for headache and vomiting that does not resolve within the next couple of days. She was  instructed to return to the emergency department for symptoms that change or worsen if she is unable schedule an appointment.  Pertinent labs & imaging results that were available during my care of the patient were reviewed by me and considered in my medical decision making (see chart for details). ____________________________________________   FINAL CLINICAL IMPRESSION(S) / ED DIAGNOSES  Final diagnoses:  Acute nonintractable headache, unspecified headache type  Non-intractable vomiting with nausea, unspecified vomiting type  Cocaine abuse  Marijuana use      Chinita PesterCari B Macyn Remmert, FNP 03/21/16 1340    Governor Rooksebecca Lord, MD 03/21/16 1454

## 2016-03-21 NOTE — ED Triage Notes (Signed)
Pt states I had have a headache for three days and had to call out of work because of it.

## 2016-03-21 NOTE — ED Notes (Signed)
See triage note   States she developed some weakness with nausea about 3-4 days ago  Then generalized headaches.Kathy Henry. Unsure of fever  But did have diarrhea. States she had near syncopal episode at work last pm  Afebrile on arrival

## 2016-03-21 NOTE — ED Notes (Signed)
Resting at present in dark room

## 2016-07-15 ENCOUNTER — Encounter: Payer: Self-pay | Admitting: Emergency Medicine

## 2016-07-15 ENCOUNTER — Emergency Department
Admission: EM | Admit: 2016-07-15 | Discharge: 2016-07-15 | Disposition: A | Discharge status: Home / Self Care | Payer: Self-pay | Attending: Student in an Organized Health Care Education/Training Program | Admitting: Student in an Organized Health Care Education/Training Program

## 2016-07-15 DIAGNOSIS — G43A0 Cyclical vomiting, not intractable: Secondary | ICD-10-CM | POA: Insufficient documentation

## 2016-07-15 DIAGNOSIS — Z79899 Other long term (current) drug therapy: Secondary | ICD-10-CM | POA: Insufficient documentation

## 2016-07-15 DIAGNOSIS — Z87891 Personal history of nicotine dependence: Secondary | ICD-10-CM | POA: Insufficient documentation

## 2016-07-15 DIAGNOSIS — E860 Dehydration: Secondary | ICD-10-CM

## 2016-07-15 DIAGNOSIS — R112 Nausea with vomiting, unspecified: Secondary | ICD-10-CM

## 2016-07-15 LAB — URINALYSIS, COMPLETE (UACMP) WITH MICROSCOPIC
BILIRUBIN URINE: NEGATIVE
GLUCOSE, UA: NEGATIVE mg/dL
KETONES UR: NEGATIVE mg/dL
NITRITE: NEGATIVE
PH: 6 (ref 5.0–8.0)
Protein, ur: NEGATIVE mg/dL
Specific Gravity, Urine: 1.002 — ABNORMAL LOW (ref 1.005–1.030)

## 2016-07-15 LAB — CBC
HCT: 39.5 % (ref 35.0–47.0)
HEMOGLOBIN: 13.7 g/dL (ref 12.0–16.0)
MCH: 31.5 pg (ref 26.0–34.0)
MCHC: 34.8 g/dL (ref 32.0–36.0)
MCV: 90.5 fL (ref 80.0–100.0)
Platelets: 318 10*3/uL (ref 150–440)
RBC: 4.36 MIL/uL (ref 3.80–5.20)
RDW: 12.7 % (ref 11.5–14.5)
WBC: 5.4 10*3/uL (ref 3.6–11.0)

## 2016-07-15 LAB — COMPREHENSIVE METABOLIC PANEL
ALBUMIN: 4.1 g/dL (ref 3.5–5.0)
ALT: 10 U/L — ABNORMAL LOW (ref 14–54)
ANION GAP: 5 (ref 5–15)
AST: 23 U/L (ref 15–41)
Alkaline Phosphatase: 47 U/L (ref 38–126)
BILIRUBIN TOTAL: 0.4 mg/dL (ref 0.3–1.2)
BUN: 6 mg/dL (ref 6–20)
CO2: 29 mmol/L (ref 22–32)
Calcium: 8.9 mg/dL (ref 8.9–10.3)
Chloride: 102 mmol/L (ref 101–111)
Creatinine, Ser: 0.68 mg/dL (ref 0.44–1.00)
Glucose, Bld: 95 mg/dL (ref 65–99)
POTASSIUM: 3.4 mmol/L — AB (ref 3.5–5.1)
Sodium: 136 mmol/L (ref 135–145)
TOTAL PROTEIN: 7 g/dL (ref 6.5–8.1)

## 2016-07-15 LAB — URINE DRUG SCREEN, QUALITATIVE (ARMC ONLY)
Amphetamines, Ur Screen: NOT DETECTED
Barbiturates, Ur Screen: NOT DETECTED
Benzodiazepine, Ur Scrn: POSITIVE — AB
COCAINE METABOLITE, UR ~~LOC~~: POSITIVE — AB
Cannabinoid 50 Ng, Ur ~~LOC~~: POSITIVE — AB
MDMA (Ecstasy)Ur Screen: NOT DETECTED
METHADONE SCREEN, URINE: NOT DETECTED
Opiate, Ur Screen: NOT DETECTED
Phencyclidine (PCP) Ur S: NOT DETECTED
TRICYCLIC, UR SCREEN: NOT DETECTED

## 2016-07-15 LAB — LIPASE, BLOOD: Lipase: 22 U/L (ref 11–51)

## 2016-07-15 LAB — POCT PREGNANCY, URINE: PREG TEST UR: NEGATIVE

## 2016-07-15 MED ORDER — PROMETHAZINE HCL 25 MG/ML IJ SOLN
12.5000 mg | Freq: Once | INTRAMUSCULAR | Status: AC
  Administered 2016-07-15: 12.5 mg via INTRAVENOUS
  Filled 2016-07-15: qty 1

## 2016-07-15 MED ORDER — SODIUM CHLORIDE 0.9 % IV BOLUS (SEPSIS)
1000.0000 mL | Freq: Once | INTRAVENOUS | Status: AC
  Administered 2016-07-15: 1000 mL via INTRAVENOUS

## 2016-07-15 MED ORDER — PROMETHAZINE HCL 12.5 MG PO TABS: 12.5000 mg | ORAL_TABLET | Freq: Four times a day (QID) | ORAL | 0 refills | Status: DC | PRN

## 2016-07-15 NOTE — ED Notes (Signed)
In and out cath not needed at this time. Patient able to void.

## 2016-07-15 NOTE — ED Notes (Signed)
Patient able to drink water and keep it down. Patient states, "I feel so much better". Dr. Roxan Hockeyobinson updated and aware.

## 2016-07-15 NOTE — ED Notes (Signed)
Patient attempted to give urine sample. Unable to do so at this time. Will attempt again post IV fluids.

## 2016-07-15 NOTE — ED Notes (Signed)
Patient resting with eyes closed. Even and non labored respirations noted. IV fluids infusing. Call light within reach.

## 2016-07-15 NOTE — ED Provider Notes (Signed)
[facility] Emergency Department Provider Note    None    (approximate)  I have reviewed the triage vital signs and the nursing notes.   HISTORY  Chief Complaint Emesis    HPI Kathy Henry is a 26 y.o. female with 3 days of nausea and vomiting. Patient states she's been able to keep anything down and has not urinated in 3 days. Denies any abdominal pain. No fever. Any diarrhea. No recent sick contacts. Patient called out of work yesterday and went to work today and her manager sent her to the ER to be evaluated. States that she feels rundown and fatigued. Has not tried anything at home to help with symptoms.   Past Medical History:  Diagnosis Date  . Anxiety   . Bipolar 1 disorder (HCC)   . Depression   . OCD (obsessive compulsive disorder)    History reviewed. No pertinent family history. Past Surgical History:  Procedure Laterality Date  . FRACTURE SURGERY     Patient Active Problem List   Diagnosis Date Noted  . Borderline personality disorder 08/20/2015  . Tobacco use disorder 08/17/2015  . Cannabis use disorder, severe, dependence (HCC) 08/17/2015  . MDD (major depressive disorder), recurrent episode, severe (HCC) 08/17/2015  . Cocaine use disorder, mild, abuse 08/17/2015  . 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 Opioid use disorder, moderate, dependence (HCC) 08/17/2015  . Migraines 08/16/2015      Prior to Admission medications   Medication Sig Start Date End Date Taking? Authorizing Provider  albuterol (PROVENTIL HFA;VENTOLIN HFA) 108 (90 BASE) MCG/ACT inhaler Inhale 2 puffs into the lungs every 6 (six) hours as needed for wheezing or shortness of breath. 06/27/14   Minna Antis, MD  cyclobenzaprine (FLEXERIL) 5 MG tablet Take 1 tablet (5 mg total) by mouth 3 (three) times daily as needed for muscle spasms. 10/02/15   Menshew, Charlesetta Ivory, PA-C  fexofenadine-pseudoephedrine (ALLEGRA-D) 60-120 MG 12 hr  tablet Take 1 tablet by mouth 2 (two) times daily. 02/16/16   Joni Reining, PA-C  FLUoxetine (PROZAC) 10 MG capsule Take 1 capsule (10 mg total) by mouth daily. 08/20/15   Jimmy Footman, MD  fluticasone (FLONASE) 50 MCG/ACT nasal spray Place 2 sprays into both nostrils daily. 02/09/16 02/08/17  Enid Derry, PA-C  ketorolac (TORADOL) 10 MG tablet Take 1 tablet (10 mg total) by mouth every 6 (six) hours as needed. 03/21/16   Triplett, Kasandra Knudsen, FNP  promethazine (PHENERGAN) 12.5 MG tablet Take 1 tablet (12.5 mg total) by mouth every 6 (six) hours as needed for nausea or vomiting. 03/21/16   Triplett, Kasandra Knudsen, FNP  promethazine (PHENERGAN) 12.5 MG tablet Take 1 tablet (12.5 mg total) by mouth every 6 (six) hours as needed for nausea or vomiting. 07/15/16   Willy Eddy, MD  traMADol (ULTRAM) 50 MG tablet Take 1 tablet (50 mg total) by mouth every 6 (six) hours as needed for moderate pain. 02/16/16   Joni Reining, PA-C  traZODone (DESYREL) 100 MG tablet Take 1 tablet (100 mg total) by mouth at bedtime. 08/20/15   Jimmy Footman, MD    Allergies Patient has no known allergies.    Social History Social History  Substance Use Topics  . Smoking status: Former Smoker    Packs/day: 0.50    Years: 5.00    Quit date: 07/18/2015  . Smokeless tobacco: Never Used  . Alcohol use No    Review of Systems Patient denies headaches, rhinorrhea,  blurry vision, numbness, shortness of breath, chest pain, edema, cough, abdominal pain, nausea, vomiting, diarrhea, dysuria, fevers, rashes or hallucinations unless otherwise stated above in HPI. ____________________________________________   PHYSICAL EXAM:  VITAL SIGNS: Vitals:   07/15/16 0920 07/15/16 0930  BP: 110/87 94/66  Pulse: 72 60  Resp: 17 16  Temp:      Constitutional: Alert and oriented. Well appearing and in no acute distress. Eyes: Conjunctivae are normal.  Head: Atraumatic. Nose: No  congestion/rhinnorhea. Mouth/Throat: Mucous membranes are moist.   Neck: No stridor. Painless ROM.  Cardiovascular: Normal rate, regular rhythm. Grossly normal heart sounds.  Good peripheral circulation. Respiratory: Normal respiratory effort.  No retractions. Lungs CTAB. Gastrointestinal: Soft and nontender. No distention. No abdominal bruits. No CVA tenderness. Musculoskeletal: No lower extremity tenderness nor edema.  No joint effusions. Neurologic:  Normal speech and language. No gross focal neurologic deficits are appreciated. No facial droop Skin:  Skin is warm, dry and intact. No rash noted. Psychiatric: Mood and affect are normal. Speech and behavior are normal.  ____________________________________________   LABS (all labs ordered are listed, but only abnormal results are displayed)  Results for orders placed or performed during the hospital encounter of 07/15/16 (from the past 24 hour(s))  Urinalysis, Complete w Microscopic     Status: Abnormal   Collection Time: 07/15/16  7:24 AM  Result Value Ref Range   Color, Urine STRAW (A) YELLOW   APPearance HAZY (A) CLEAR   Specific Gravity, Urine 1.002 (L) 1.005 - 1.030   pH 6.0 5.0 - 8.0   Glucose, UA NEGATIVE NEGATIVE mg/dL   Hgb urine dipstick SMALL (A) NEGATIVE   Bilirubin Urine NEGATIVE NEGATIVE   Ketones, ur NEGATIVE NEGATIVE mg/dL   Protein, ur NEGATIVE NEGATIVE mg/dL   Nitrite NEGATIVE NEGATIVE   Leukocytes, UA SMALL (A) NEGATIVE   RBC / HPF 0-5 0 - 5 RBC/hpf   WBC, UA 0-5 0 - 5 WBC/hpf   Bacteria, UA FEW (A) NONE SEEN   Squamous Epithelial / LPF 0-5 (A) NONE SEEN  Urine Drug Screen, Qualitative ([facility] only)     Status: Abnormal   Collection Time: 07/15/16  7:25 AM  Result Value Ref Range   Tricyclic, Ur Screen NONE DETECTED NONE DETECTED   Amphetamines, Ur Screen NONE DETECTED NONE DETECTED   MDMA (Ecstasy)Ur Screen NONE DETECTED NONE DETECTED   Cocaine Metabolite,Ur [place] POSITIVE (A) NONE DETECTED   Opiate, Ur  Screen NONE DETECTED NONE DETECTED   Phencyclidine (PCP) Ur S NONE DETECTED NONE DETECTED   Cannabinoid 50 Ng, Ur [place] POSITIVE (A) NONE DETECTED   Barbiturates, Ur Screen NONE DETECTED NONE DETECTED   Benzodiazepine, Ur Scrn POSITIVE (A) NONE DETECTED   Methadone Scn, Ur NONE DETECTED NONE DETECTED  Comprehensive metabolic panel     Status: Abnormal   Collection Time: 07/15/16  7:42 AM  Result Value Ref Range   Sodium 136 135 - 145 mmol/L   Potassium 3.4 (L) 3.5 - 5.1 mmol/L   Chloride 102 101 - 111 mmol/L   CO2 29 22 - 32 mmol/L   Glucose, Bld 95 65 - 99 mg/dL   BUN 6 6 - 20 mg/dL   Creatinine, Ser 1.61 0.44 - 1.00 mg/dL   Calcium 8.9 8.9 - 09.6 mg/dL   Total Protein 7.0 6.5 - 8.1 g/dL   Albumin 4.1 3.5 - 5.0 g/dL   AST 23 15 - 41 U/L   ALT 10 (L) 14 - 54 U/L   Alkaline Phosphatase 47 38 -  126 U/L   Total Bilirubin 0.4 0.3 - 1.2 mg/dL   GFR calc non Af Amer >60 >60 mL/min   GFR calc Af Amer >60 >60 mL/min   Anion gap 5 5 - 15  Lipase, blood     Status: None   Collection Time: 07/15/16  7:42 AM  Result Value Ref Range   Lipase 22 11 - 51 U/L  CBC     Status: None   Collection Time: 07/15/16  7:42 AM  Result Value Ref Range   WBC 5.4 3.6 - 11.0 K/uL   RBC 4.36 3.80 - 5.20 MIL/uL   Hemoglobin 13.7 12.0 - 16.0 g/dL   HCT 13.039.5 86.535.0 - 78.447.0 %   MCV 90.5 80.0 - 100.0 fL   MCH 31.5 26.0 - 34.0 pg   MCHC 34.8 32.0 - 36.0 g/dL   RDW 69.612.7 29.511.5 - 28.414.5 %   Platelets 318 150 - 440 K/uL  Pregnancy, urine POC     Status: None   Collection Time: 07/15/16  9:25 AM  Result Value Ref Range   Preg Test, Ur NEGATIVE NEGATIVE   ____________________________________________ ____________________________________________  RADIOLOGY   ____________________________________________   PROCEDURES  Procedure(s) performed:  Procedures    Critical Care performed: no ____________________________________________   INITIAL IMPRESSION / ASSESSMENT AND PLAN / ED COURSE  Pertinent labs &  imaging results that were available during my care of the patient were reviewed by me and considered in my medical decision making (see chart for details).  DDX: enteritis, gastritis, viral illness, dehydration, aki, uti  Kathy Henry is a 26 y.o. who presents to the ED with nausea vomiting. Patient well-appearing. Patient is AFVSS in ED. Exam as above. Given current presentation have considered the above differential.  Her abdominal exam is soft and benign in all 4 quadrants. She appears well-perfused but is admitted to decreased oral intake therefore we will check labs as well as provide IV fluids and IV antiemetics.    ----------------------------------------- 9:57 AM on 07/15/2016 -----------------------------------------  Blood work is reassuring. Patient is not pregnant. Patient is tolerating oral hydration. She has urinated. This point she states that she feels much better at this point is that she stable for discharge home.  ____________________________________________   FINAL CLINICAL IMPRESSION(S) / ED DIAGNOSES  Final diagnoses:  Non-intractable vomiting with nausea, unspecified vomiting type  Dehydration      NEW MEDICATIONS STARTED DURING THIS VISIT:  New Prescriptions   PROMETHAZINE (PHENERGAN) 12.5 MG TABLET    Take 1 tablet (12.5 mg total) by mouth every 6 (six) hours as needed for nausea or vomiting.     Note:  This document was prepared using Dragon voice recognition software and may include unintentional dictation errors.    Willy Eddyobinson, Man Bonneau, MD 07/15/16 [phone]

## 2016-07-15 NOTE — ED Triage Notes (Signed)
Pt c/o nausea and vomiting since Saturday and unable to keep anything down. Reports has not been able to pee for 3 days. No abdominal pain or fevers. No diarrhea.  Ambulatory to triage.

## 2016-07-15 NOTE — ED Notes (Signed)
GS MD at bedside speaking to patient.

## 2016-07-16 LAB — URINE CULTURE

## 2016-08-31 ENCOUNTER — Emergency Department: Payer: Self-pay

## 2016-08-31 ENCOUNTER — Encounter: Payer: Self-pay | Admitting: Emergency Medicine

## 2016-08-31 ENCOUNTER — Emergency Department
Admission: EM | Admit: 2016-08-31 | Discharge: 2016-08-31 | Disposition: A | Discharge status: Home / Self Care | Payer: Self-pay | Attending: Emergency Medicine | Admitting: Emergency Medicine

## 2016-08-31 DIAGNOSIS — G5601 Carpal tunnel syndrome, right upper limb: Secondary | ICD-10-CM | POA: Insufficient documentation

## 2016-08-31 DIAGNOSIS — Z87891 Personal history of nicotine dependence: Secondary | ICD-10-CM | POA: Insufficient documentation

## 2016-08-31 DIAGNOSIS — Z79899 Other long term (current) drug therapy: Secondary | ICD-10-CM | POA: Insufficient documentation

## 2016-08-31 DIAGNOSIS — F319 Bipolar disorder, unspecified: Secondary | ICD-10-CM | POA: Insufficient documentation

## 2016-08-31 MED ORDER — MELOXICAM 15 MG PO TABS: 15.0000 mg | ORAL_TABLET | Freq: Every day | ORAL | 0 refills | Status: DC

## 2016-08-31 NOTE — ED Triage Notes (Signed)
Pt states right hand pain for over one month. Pt spray paints for a living. Pt states she has tingling to 3rd-5th right fingers and pain when moving hand. Pt with slight swelling noted to right hand.

## 2016-08-31 NOTE — ED Provider Notes (Signed)
[facility] Emergency Department Provider Note  ____________________________________________  Time seen: Approximately 9:23 PM  I have reviewed the triage vital signs and the nursing notes.   HISTORY  Chief Complaint Hand Pain    HPI Kathy Henry is a 26 y.o. female who presents emergency department complaining of third, fourth, fifth digit pain to the right hand. 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 Patient denies any direct trauma to her hands or wrists. No other injury or complaint. No medications prior to arrival.   Past Medical History:  Diagnosis Date  . Anxiety   . Bipolar 1 disorder (HCC)   . Depression   . OCD (obsessive compulsive disorder)     Patient Active Problem List   Diagnosis Date Noted  . Borderline personality disorder 08/20/2015  . Tobacco use disorder 08/17/2015  . Cannabis use disorder, severe, dependence (HCC) 08/17/2015  . MDD (major depressive disorder), recurrent episode, severe (HCC) 08/17/2015  . Cocaine use disorder, mild, abuse 08/17/2015  . 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 Opioid use disorder, moderate, dependence (HCC) 08/17/2015  . Migraines 08/16/2015    Past Surgical History:  Procedure Laterality Date  . FRACTURE SURGERY      Prior to Admission medications   Medication Sig Start Date End Date Taking? Authorizing Provider  albuterol (PROVENTIL HFA;VENTOLIN HFA) 108 (90 BASE) MCG/ACT inhaler Inhale 2 puffs into the lungs every 6 (six) hours as needed for wheezing or shortness of breath. 06/27/14   Minna AntisPaduchowski, Kevin, MD  cyclobenzaprine (FLEXERIL) 5 MG tablet Take 1 tablet (5 mg total) by mouth 3 (three) times daily as needed for muscle spasms. 10/02/15   Menshew, Charlesetta IvoryJenise V Bacon, PA-C  fexofenadine-pseudoephedrine (ALLEGRA-D) 60-120 MG 12 hr tablet Take 1 tablet by mouth 2 (two) times daily.  02/16/16   Joni ReiningSmith, Ronald K, PA-C  FLUoxetine (PROZAC) 10 MG capsule Take 1 capsule (10 mg total) by mouth daily. 08/20/15   Jimmy FootmanHernandez-Gonzalez, Andrea, MD  fluticasone (FLONASE) 50 MCG/ACT nasal spray Place 2 sprays into both nostrils daily. 02/09/16 02/08/17  Enid DerryWagner, Ashley, PA-C  ketorolac (TORADOL) 10 MG tablet Take 1 tablet (10 mg total) by mouth every 6 (six) hours as needed. 03/21/16   Triplett, Rulon Eisenmengerari B, FNP  meloxicam (MOBIC) 15 MG tablet Take 1 tablet (15 mg total) by mouth daily. 08/31/16   Cuthriell, Delorise RoyalsJonathan D, PA-C  promethazine (PHENERGAN) 12.5 MG tablet Take 1 tablet (12.5 mg total) by mouth every 6 (six) hours as needed for nausea or vomiting. 03/21/16   Triplett, Kasandra Knudsenari B, FNP  promethazine (PHENERGAN) 12.5 MG tablet Take 1 tablet (12.5 mg total) by mouth every 6 (six) hours as needed for nausea or vomiting. 07/15/16   Willy Eddyobinson, Patrick, MD  traMADol (ULTRAM) 50 MG tablet Take 1 tablet (50 mg total) by mouth every 6 (six) hours as needed for moderate pain. 02/16/16   Joni ReiningSmith, Ronald K, PA-C  traZODone (DESYREL) 100 MG tablet Take 1 tablet (100 mg total) by mouth at bedtime. 08/20/15   Jimmy FootmanHernandez-Gonzalez, Andrea, MD    Allergies Patient has no known allergies.  History reviewed. No pertinent family history.  Social History Social History  Substance Use Topics  . Smoking status: Former Smoker    Packs/day: 0.50    Years: 5.00    Quit date: 07/18/2015  . Smokeless tobacco: Never Used  . Alcohol use No  Review of Systems  Constitutional: No fever/chills Cardiovascular: no chest pain. Respiratory: no cough. No SOB. Musculoskeletal: Positive for right wrist, third, fourth, fifth digit pain. Skin: Negative for rash, abrasions, lacerations, ecchymosis. Neurological: Negative for headaches, focal weakness or numbness. 10-point ROS otherwise negative.  ____________________________________________   PHYSICAL EXAM:  VITAL SIGNS: ED Triage Vitals  Enc Vitals Group     BP 08/31/16  2038 131/83     Pulse Rate 08/31/16 2038 84     Resp 08/31/16 2038 16     Temp 08/31/16 2038 98.6 F (37 C)     Temp Source 08/31/16 2038 Oral     SpO2 08/31/16 2038 100 %     Weight 08/31/16 2038 126 lb (57.2 kg)     Height 08/31/16 2038 5\' 8"  (1.727 m)     Head Circumference --      Peak Flow --      Pain Score 08/31/16 2037 8     Pain Loc --      Pain Edu? --      Excl. in GC? --      Constitutional: Alert and oriented. Well appearing and in no acute distress. Eyes: Conjunctivae are normal. PERRL. EOMI. Head: Atraumatic. Neck: No stridor.    Cardiovascular: Normal rate, regular rhythm. Normal S1 and S2.  Good peripheral circulation. Respiratory: Normal respiratory effort without tachypnea or retractions. Lungs CTAB. Good air entry to the bases with no decreased or absent breath sounds. Musculoskeletal: Full range of motion to all extremities. No gross deformities appreciated. No visible deformity or gross edema noted to the right wrist or hand upon inspection. Full range of motion to the right wrist and all digits right hand. Negative Tinel's and positive Phalen's. Cap refill intact all 5 digits. Sensation intact all 5 digits. Neurologic:  Normal speech and language. No gross focal neurologic deficits are appreciated.  Skin:  Skin is warm, dry and intact. No rash noted. Psychiatric: Mood and affect are normal. Speech and behavior are normal. Patient exhibits appropriate insight and judgement.   ____________________________________________   LABS (all labs ordered are listed, but only abnormal results are displayed)  Labs Reviewed - No data to display ____________________________________________  EKG   ____________________________________________  RADIOLOGY   No results found.  ____________________________________________    PROCEDURES  Procedure(s) performed:    Procedures    Medications - No data to  display   ____________________________________________   INITIAL IMPRESSION / ASSESSMENT AND PLAN / ED COURSE  Pertinent labs & imaging results that were available during my care of the patient were reviewed by me and considered in my medical decision making (see chart for details).  Review of the [place] CSRS was performed in accordance of the [facility] prior to dispensing any controlled drugs.     Patient's diagnosis is consistent with carpal tunnel syndrome to the right wrist. No imaging or labs today necessary at this time. Patient is given a Velcro wrist brace in the emergency department. Patient will be discharged home with prescriptions for anti-inflammatories. Patient is to follow up with orthopedics as needed or otherwise directed. Patient is given ED precautions to return to the ED for any worsening or new symptoms.     ____________________________________________  FINAL CLINICAL IMPRESSION(S) / ED DIAGNOSES  Final diagnoses:  Carpal tunnel syndrome of right wrist      NEW MEDICATIONS STARTED DURING THIS VISIT:  New Prescriptions   MELOXICAM (MOBIC) 15 MG TABLET    Take 1 tablet (15 mg total)  by mouth daily.        This chart was dictated using voice recognition software/Dragon. Despite best efforts to proofread, errors can occur which can change the meaning. Any change was purely unintentional.    Racheal Patches, PA-C 08/31/16 2131    Merrily Brittle, MD 08/31/16 2213

## 2017-01-16 ENCOUNTER — Encounter: Payer: Self-pay | Admitting: Emergency Medicine

## 2017-01-16 ENCOUNTER — Emergency Department
Admission: EM | Admit: 2017-01-16 | Discharge: 2017-01-16 | Disposition: A | Discharge status: Home / Self Care | Payer: Self-pay | Attending: Emergency Medicine | Admitting: Emergency Medicine

## 2017-01-16 ENCOUNTER — Other Ambulatory Visit: Payer: Self-pay

## 2017-01-16 ENCOUNTER — Emergency Department: Payer: Self-pay

## 2017-01-16 DIAGNOSIS — W228XXA Striking against or struck by other objects, initial encounter: Secondary | ICD-10-CM | POA: Insufficient documentation

## 2017-01-16 DIAGNOSIS — Y929 Unspecified place or not applicable: Secondary | ICD-10-CM | POA: Insufficient documentation

## 2017-01-16 DIAGNOSIS — Y939 Activity, unspecified: Secondary | ICD-10-CM | POA: Insufficient documentation

## 2017-01-16 DIAGNOSIS — S93509A Unspecified sprain of unspecified toe(s), initial encounter: Secondary | ICD-10-CM | POA: Insufficient documentation

## 2017-01-16 DIAGNOSIS — Z87891 Personal history of nicotine dependence: Secondary | ICD-10-CM | POA: Insufficient documentation

## 2017-01-16 DIAGNOSIS — Z79899 Other long term (current) drug therapy: Secondary | ICD-10-CM | POA: Insufficient documentation

## 2017-01-16 DIAGNOSIS — Y999 Unspecified external cause status: Secondary | ICD-10-CM | POA: Insufficient documentation

## 2017-01-16 MED ORDER — IBUPROFEN 600 MG PO TABS: 600.0000 mg | ORAL_TABLET | Freq: Four times a day (QID) | ORAL | 0 refills | Status: DC | PRN

## 2017-01-16 NOTE — ED Notes (Signed)
See triage note   Having pain to left foot mainly at 4 th toes area  No deformity or bruising noted

## 2017-01-16 NOTE — ED Triage Notes (Signed)
Pt reports pain to left 4th toe x3 days, reports injury. Minor bruising noted, no swelling. Pt reports pain to toe and numbness.

## 2017-01-16 NOTE — ED Provider Notes (Signed)
[facility] Emergency Department Provider Note  ____________________________________________  Time seen: Approximately 5:31 PM  I have reviewed the triage vital signs and the nursing notes.   HISTORY  Chief Complaint Toe Pain    HPI Kathy Henry is a 26 y.o. female that presents to the emergency department for evaluation of left fourth toe pain after injury 3 days ago.  Patient states that she stubbed her toe.  She did not notice the bruising until 2 days ago.  She is concerned that it is broken.  She states that toe occasionally "feels numb but can still feel the pain." She denies any additional injuries or concerns.  No tingling.   Past Medical History:  Diagnosis Date  . Anxiety   . Bipolar 1 disorder (HCC)   . Depression   . OCD (obsessive compulsive disorder)     Patient Active Problem List   Diagnosis Date Noted  . Borderline personality disorder (HCC) 08/20/2015  . Tobacco use disorder 08/17/2015  . Cannabis use disorder, severe, dependence (HCC) 08/17/2015  . MDD (major depressive disorder), recurrent episode, severe (HCC) 08/17/2015  . Cocaine use disorder, mild, abuse (HCC) 08/17/2015  . 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 Opioid use disorder, moderate, dependence (HCC) 08/17/2015  . Migraines 08/16/2015    Past Surgical History:  Procedure Laterality Date  . FRACTURE SURGERY      Prior to Admission medications   Medication Sig Start Date End Date Taking? Authorizing Provider  albuterol (PROVENTIL HFA;VENTOLIN HFA) 108 (90 BASE) MCG/ACT inhaler Inhale 2 puffs into the lungs every 6 (six) hours as needed for wheezing or shortness of breath. 06/27/14   Minna Antis, MD  cyclobenzaprine (FLEXERIL) 5 MG tablet Take 1 tablet (5 mg total) by mouth 3 (three) times daily as needed for muscle spasms. 10/02/15   Menshew, Charlesetta Ivory, PA-C  fexofenadine-pseudoephedrine (ALLEGRA-D) 60-120 MG 12 hr tablet  Take 1 tablet by mouth 2 (two) times daily. 02/16/16   Joni Reining, PA-C  FLUoxetine (PROZAC) 10 MG capsule Take 1 capsule (10 mg total) by mouth daily. 08/20/15   Jimmy Footman, MD  fluticasone (FLONASE) 50 MCG/ACT nasal spray Place 2 sprays into both nostrils daily. 02/09/16 02/08/17  Enid Derry, PA-C  ibuprofen (ADVIL,MOTRIN) 600 MG tablet Take 1 tablet (600 mg total) by mouth every 6 (six) hours as needed. 01/16/17   Enid Derry, PA-C  ketorolac (TORADOL) 10 MG tablet Take 1 tablet (10 mg total) by mouth every 6 (six) hours as needed. 03/21/16   Triplett, Rulon Eisenmenger B, FNP  meloxicam (MOBIC) 15 MG tablet Take 1 tablet (15 mg total) by mouth daily. 08/31/16   Cuthriell, Delorise Royals, PA-C  promethazine (PHENERGAN) 12.5 MG tablet Take 1 tablet (12.5 mg total) by mouth every 6 (six) hours as needed for nausea or vomiting. 03/21/16   Triplett, Kasandra Knudsen, FNP  promethazine (PHENERGAN) 12.5 MG tablet Take 1 tablet (12.5 mg total) by mouth every 6 (six) hours as needed for nausea or vomiting. 07/15/16   Willy Eddy, MD  traMADol (ULTRAM) 50 MG tablet Take 1 tablet (50 mg total) by mouth every 6 (six) hours as needed for moderate pain. 02/16/16   Joni Reining, PA-C  traZODone (DESYREL) 100 MG tablet Take 1 tablet (100 mg total) by mouth at bedtime. 08/20/15   Jimmy Footman, MD    Allergies Patient has no known allergies.  No family history on file.  Social History Social History   Tobacco  Use  . Smoking status: Former Smoker    Packs/day: 0.50    Years: 5.00    Pack years: 2.50    Last attempt to quit: 07/18/2015    Years since quitting: 1.5  . Smokeless tobacco: Never Used  Substance Use Topics  . Alcohol use: No  . Drug use: No     Review of Systems  Cardiovascular: No chest pain. Respiratory:  No SOB. Musculoskeletal: Positive for toe pain. Skin: Negative for rash, abrasions, lacerations. Positive for ecchymosis. Neurological: Negative for  headaches   ____________________________________________   PHYSICAL EXAM:  VITAL SIGNS: ED Triage Vitals  Enc Vitals Group     BP 01/16/17 1712 (!) 150/81     Pulse Rate 01/16/17 1712 (!) 101     Resp 01/16/17 1712 16     Temp 01/16/17 1712 98.4 F (36.9 C)     Temp Source 01/16/17 1712 Oral     SpO2 01/16/17 1712 99 %     Weight 01/16/17 1714 125 lb (56.7 kg)     Height 01/16/17 1714 5\' 8"  (1.727 m)     Head Circumference --      Peak Flow --      Pain Score 01/16/17 1713 10     Pain Loc --      Pain Edu? --      Excl. in GC? --      Constitutional: Alert and oriented. Well appearing and in no acute distress. Eyes: Conjunctivae are normal. PERRL. EOMI. Head: Atraumatic. ENT:      Ears:      Nose: No congestion/rhinnorhea.      Mouth/Throat: Mucous membranes are moist.  Neck: No stridor.  Cardiovascular:  Good peripheral circulation.  Cap refill of left fourth toe less than 3 seconds. Respiratory: Normal respiratory effort without tachypnea or retractions. Good air entry to the bases with no decreased or absent breath sounds. Musculoskeletal: Full range of motion to all extremities. No gross deformities appreciated. Pain with palpation over left fourth toe. Neurologic:  Normal speech and language. No gross focal neurologic deficits are appreciated.  Skin:  Skin is warm, dry and intact.  Mild bruising to left fourth toe.     ____________________________________________   LABS (all labs ordered are listed, but only abnormal results are displayed)  Labs Reviewed - No data to display ____________________________________________  EKG   ____________________________________________  RADIOLOGY Lexine BatonI, Sophia Cubero, personally viewed and evaluated these images (plain radiographs) as part of my medical decision making, as well as reviewing the written report by the radiologist.  Dg Foot Complete Left  Result Date: 01/16/2017 CLINICAL DATA:  Pain to the left toe for 3  days EXAM: LEFT FOOT - COMPLETE 3+ VIEW COMPARISON:  None. FINDINGS: There is no evidence of fracture or dislocation. There is no evidence of arthropathy or other focal bone abnormality. Soft tissues are unremarkable. IMPRESSION: Negative. Electronically Signed   By: Jasmine PangKim  Fujinaga M.D.   On: 01/16/2017 17:45    ____________________________________________    PROCEDURES  Procedure(s) performed:    Procedures    Medications - No data to display   ____________________________________________   INITIAL IMPRESSION / ASSESSMENT AND PLAN / ED COURSE  Pertinent labs & imaging results that were available during my care of the patient were reviewed by me and considered in my medical decision making (see chart for details).  Review of the [place] CSRS was performed in accordance of the [facility] prior to dispensing any controlled drugs.   Patient's  diagnosis is consistent with toe sprain.  Vital signs and exam are reassuring.  X-ray negative for acute bony abnormalities.  Patient does not want crutches or a postop shoe.  Patient will be discharged home with prescriptions for ibuprofen. Patient is to follow up with PCP as directed. Patient is given ED precautions to return to the ED for any worsening or new symptoms.     ____________________________________________  FINAL CLINICAL IMPRESSION(S) / ED DIAGNOSES  Final diagnoses:  Sprain of toe, initial encounter      NEW MEDICATIONS STARTED DURING THIS VISIT:  ED Discharge Orders        Ordered    ibuprofen (ADVIL,MOTRIN) 600 MG tablet  Every 6 hours PRN     01/16/17 1816          This chart was dictated using voice recognition software/Dragon. Despite best efforts to proofread, errors can occur which can change the meaning. Any change was purely unintentional.    Enid DerryWagner, Gurtha Picker, PA-C 01/16/17 1831    Emily FilbertWilliams, Jonathan E, MD 01/16/17 2120

## 2017-01-16 NOTE — ED Notes (Signed)
X-ray at bedside

## 2017-01-16 NOTE — ED Triage Notes (Signed)
FIRST NURSE NOTE-stubbed toe and reports it is purple

## 2017-09-24 ENCOUNTER — Emergency Department
Admission: EM | Admit: 2017-09-24 | Discharge: 2017-09-24 | Disposition: A | Discharge status: Home / Self Care | Payer: Self-pay | Attending: Emergency Medicine | Admitting: Emergency Medicine

## 2017-09-24 ENCOUNTER — Encounter: Payer: Self-pay | Admitting: Physician Assistant

## 2017-09-24 ENCOUNTER — Other Ambulatory Visit: Payer: Self-pay

## 2017-09-24 DIAGNOSIS — Y929 Unspecified place or not applicable: Secondary | ICD-10-CM | POA: Insufficient documentation

## 2017-09-24 DIAGNOSIS — Z23 Encounter for immunization: Secondary | ICD-10-CM | POA: Insufficient documentation

## 2017-09-24 DIAGNOSIS — Y9389 Activity, other specified: Secondary | ICD-10-CM | POA: Insufficient documentation

## 2017-09-24 DIAGNOSIS — W268XXA Contact with other sharp object(s), not elsewhere classified, initial encounter: Secondary | ICD-10-CM | POA: Insufficient documentation

## 2017-09-24 DIAGNOSIS — Z87891 Personal history of nicotine dependence: Secondary | ICD-10-CM | POA: Insufficient documentation

## 2017-09-24 DIAGNOSIS — S41012A Laceration without foreign body of left shoulder, initial encounter: Secondary | ICD-10-CM | POA: Insufficient documentation

## 2017-09-24 DIAGNOSIS — S41112A Laceration without foreign body of left upper arm, initial encounter: Secondary | ICD-10-CM

## 2017-09-24 DIAGNOSIS — Y999 Unspecified external cause status: Secondary | ICD-10-CM | POA: Insufficient documentation

## 2017-09-24 MED ORDER — BACITRACIN-NEOMYCIN-POLYMYXIN 400-5-5000 EX OINT
TOPICAL_OINTMENT | Freq: Once | CUTANEOUS | Status: AC
  Administered 2017-09-24: 1 via TOPICAL

## 2017-09-24 MED ORDER — TETANUS-DIPHTH-ACELL PERTUSSIS 5-2.5-18.5 LF-MCG/0.5 IM SUSP
0.5000 mL | Freq: Once | INTRAMUSCULAR | Status: AC
  Administered 2017-09-24: 0.5 mL via INTRAMUSCULAR
  Filled 2017-09-24: qty 0.5

## 2017-09-24 MED ORDER — CEPHALEXIN 500 MG PO CAPS: 500.0000 mg | ORAL_CAPSULE | Freq: Three times a day (TID) | ORAL | 0 refills | Status: DC

## 2017-09-24 MED ORDER — BACITRACIN-NEOMYCIN-POLYMYXIN 400-5-5000 EX OINT
TOPICAL_OINTMENT | CUTANEOUS | Status: AC
  Administered 2017-09-24: 1 via TOPICAL
  Filled 2017-09-24: qty 1

## 2017-09-24 MED ORDER — LIDOCAINE HCL (PF) 1 % IJ SOLN
5.0000 mL | Freq: Once | INTRAMUSCULAR | Status: AC
  Administered 2017-09-24: 5 mL via INTRADERMAL
  Filled 2017-09-24: qty 5

## 2017-09-24 MED ORDER — BACITRACIN ZINC 500 UNIT/GM EX OINT: 1.0000 "application " | TOPICAL_OINTMENT | Freq: Once | CUTANEOUS | Status: DC

## 2017-09-24 NOTE — Discharge Instructions (Addendum)
Wash the area with soap and water daily.  Return emergency department if any sign of infection.  Be sure to take the antibiotic as the wound was old.

## 2017-09-24 NOTE — ED Provider Notes (Signed)
[facility] Emergency Department Provider Note  ____________________________________________   First MD Initiated Contact with Patient 09/24/17 1640     (approximate)  I have reviewed the triage vital signs and the nursing notes.   HISTORY  Chief Complaint Laceration    HPI Kathy Henry is a 27 y.o. female presents to the emergency department with a laceration to the anterior part of the left shoulder that she sustained yesterday.  She was moving furniture and a sharp edge of the furniture caught her arm.  She denies any numbness or tingling.  She denies any fever or chills.  States the area is just very sore.  Is unsure of her last tetanus    Past Medical History:  Diagnosis Date  . Anxiety   . Bipolar 1 disorder (HCC)   . Depression   . OCD (obsessive compulsive disorder)     Patient Active Problem List   Diagnosis Date Noted  . Borderline personality disorder (HCC) 08/20/2015  . Tobacco use disorder 08/17/2015  . Cannabis use disorder, severe, dependence (HCC) 08/17/2015  . MDD (major depressive disorder), recurrent episode, severe (HCC) 08/17/2015  . Cocaine use disorder, mild, abuse (HCC) 08/17/2015  . 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 Opioid use disorder, moderate, dependence (HCC) 08/17/2015  . Migraines 08/16/2015    Past Surgical History:  Procedure Laterality Date  . FRACTURE SURGERY      Prior to Admission medications   Medication Sig Start Date End Date Taking? Authorizing Provider  cephALEXin (KEFLEX) 500 MG capsule Take 1 capsule (500 mg total) by mouth 3 (three) times daily. 09/24/17   Faythe Ghee, PA-C    Allergies Patient has no known allergies.  No family history on file.  Social History Social History   Tobacco Use  . Smoking status: Former Smoker    Packs/day: 0.50    Years: 5.00    Pack years: 2.50    Last attempt to quit: 07/18/2015    Years since quitting: 2.1  .  Smokeless tobacco: Never Used  Substance Use Topics  . Alcohol use: No  . Drug use: No    Review of Systems  Constitutional: No fever/chills Eyes: No visual changes. ENT: No sore throat. Respiratory: Denies cough Genitourinary: Negative for dysuria. Musculoskeletal: Negative for back pain. Skin: Negative for rash.  Positive laceration to the anterior left shoulder    ____________________________________________   PHYSICAL EXAM:  VITAL SIGNS: ED Triage Vitals  Enc Vitals Group     BP 09/24/17 1644 131/76     Pulse Rate 09/24/17 1644 (!) 120     Resp 09/24/17 1644 16     Temp 09/24/17 1644 99.9 F (37.7 C)     Temp Source 09/24/17 1644 Oral     SpO2 09/24/17 1644 100 %     Weight 09/24/17 1641 128 lb (58.1 kg)     Height 09/24/17 1641 5\' 8"  (1.727 m)     Head Circumference --      Peak Flow --      Pain Score 09/24/17 1641 10     Pain Loc --      Pain Edu? --      Excl. in GC? --     Constitutional: Alert and oriented. Well appearing and in no acute distress. Eyes: Conjunctivae are normal.  Head: Atraumatic. Nose: No congestion/rhinnorhea. Mouth/Throat: Mucous membranes are moist.   Neck:  supple no lymphadenopathy noted Cardiovascular: Normal rate, regular rhythm. Respiratory:  Normal respiratory effort.  No retractions, GU: deferred Musculoskeletal: FROM all extremities, warm and well perfused Neurologic:  Normal speech and language.  Skin:  Skin is warm, dry.  Positive for a 3 cm laceration to the left shoulder.  The area is old and dry.  There is no active bleeding noted.  No foreign body is noted. Psychiatric: Mood and affect are normal. Speech and behavior are normal.  ____________________________________________   LABS (all labs ordered are listed, but only abnormal results are displayed)  Labs Reviewed - No data to  display ____________________________________________   ____________________________________________  RADIOLOGY    ____________________________________________   PROCEDURES  Procedure(s) performed:  Marland KitchenMarland KitchenLaceration Repair Date/Time: 09/24/2017 6:19 PM Performed by: Faythe Ghee, PA-C Authorized by: Faythe Ghee, PA-C   Consent:    Consent obtained:  Verbal   Consent given by:  Patient   Risks discussed:  Infection, pain, poor cosmetic result and poor wound healing   Alternatives discussed:  Observation Anesthesia (see MAR for exact dosages):    Anesthesia method:  Local infiltration   Local anesthetic:  Lidocaine 1% w/o epi Laceration details:    Location:  Shoulder/arm   Shoulder/arm location:  L shoulder   Length (cm):  3   Depth (mm):  2 Repair type:    Repair type:  Simple Pre-procedure details:    Preparation:  Patient was prepped and draped in usual sterile fashion Exploration:    Hemostasis achieved with:  Direct pressure   Wound exploration: wound explored through full range of motion     Wound extent: no foreign bodies/material noted, no tendon damage noted, no underlying fracture noted and no vascular damage noted     Contaminated: no   Treatment:    Area cleansed with:  Betadine and saline   Amount of cleaning:  Extensive   Irrigation solution:  Sterile saline   Irrigation method:  Pressure wash, syringe and tap Skin repair:    Repair method:  Sutures   Suture size:  5-0   Suture material:  Nylon   Suture technique:  Simple interrupted   Number of sutures:  4 Approximation:    Approximation:  Close Post-procedure details:    Dressing:  Antibiotic ointment and non-adherent dressing   Patient tolerance of procedure:  Tolerated well, no immediate complications      ____________________________________________   INITIAL IMPRESSION / ASSESSMENT AND PLAN / ED COURSE  Pertinent labs & imaging results that were available during my care of the  patient were reviewed by me and considered in my medical decision making (see chart for details).   Patient is 27 year old female presents emergency department with laceration to the anterior left shoulder.  She was moving furniture yesterday and something sharp on the chair caught her.  She is unsure of her last tetanus.  On physical exam the wound on the anterior left shoulder appears old.  There is no obvious bleeding or drainage noted.  No foreign body or tendon injury is noted.  Tdap given to the patient.  After discussing suturing and old wound the patient agrees to take an antibiotic to prevent infection.  The laceration was repaired with 4 simple sutures.  She was given a prescription for Keflex 500 3 times daily for 7 days.  She is to wash the area soap and water daily.  Return to the emergency department if any sign of infection.  She states she understands and will comply with our instructions.  She was discharged in stable condition.  As part of my medical decision making, I reviewed the following data within the electronic MEDICAL RECORD NUMBER Nursing notes reviewed and incorporated, Old chart reviewed, Notes from prior ED visits and [place] Controlled Substance Database  ____________________________________________   FINAL CLINICAL IMPRESSION(S) / ED DIAGNOSES  Final diagnoses:  Laceration of left upper extremity, initial encounter      NEW MEDICATIONS STARTED DURING THIS VISIT:  Discharge Medication List as of 09/24/2017  5:16 PM    START taking these medications   Details  cephALEXin (KEFLEX) 500 MG capsule Take 1 capsule (500 mg total) by mouth 3 (three) times daily., Starting Thu 09/24/2017, Normal         Note:  This document was prepared using Dragon voice recognition software and may include unintentional dictation errors.    Faythe Ghee, PA-C 09/24/17 Mikle Bosworth, MD 09/24/17 2119

## 2017-09-24 NOTE — ED Triage Notes (Signed)
Presents with laceration to anterior left shoulder  States she was moving furniture  Bleeding controlled at present

## 2017-09-24 NOTE — ED Notes (Signed)
ED Provider at bedside. 

## 2017-09-24 NOTE — ED Notes (Signed)
DC instructions discussed with patient and friend, informed pt to take entire course of antibiotics and the importance of the wound being clean and dry.  Pt verbalized understanding.  Ambulatory to lobby.

## 2017-10-23 ENCOUNTER — Other Ambulatory Visit: Payer: Self-pay

## 2017-10-23 ENCOUNTER — Emergency Department
Admission: EM | Admit: 2017-10-23 | Discharge: 2017-10-23 | Disposition: A | Discharge status: Home / Self Care | Payer: Self-pay | Attending: Emergency Medicine | Admitting: Emergency Medicine

## 2017-10-23 ENCOUNTER — Emergency Department: Payer: Self-pay

## 2017-10-23 DIAGNOSIS — S0003XA Contusion of scalp, initial encounter: Secondary | ICD-10-CM | POA: Insufficient documentation

## 2017-10-23 DIAGNOSIS — F319 Bipolar disorder, unspecified: Secondary | ICD-10-CM | POA: Insufficient documentation

## 2017-10-23 DIAGNOSIS — Y999 Unspecified external cause status: Secondary | ICD-10-CM | POA: Insufficient documentation

## 2017-10-23 DIAGNOSIS — W01198A Fall on same level from slipping, tripping and stumbling with subsequent striking against other object, initial encounter: Secondary | ICD-10-CM | POA: Insufficient documentation

## 2017-10-23 DIAGNOSIS — Y929 Unspecified place or not applicable: Secondary | ICD-10-CM | POA: Insufficient documentation

## 2017-10-23 DIAGNOSIS — T07XXXA Unspecified multiple injuries, initial encounter: Secondary | ICD-10-CM

## 2017-10-23 DIAGNOSIS — Z87891 Personal history of nicotine dependence: Secondary | ICD-10-CM | POA: Insufficient documentation

## 2017-10-23 DIAGNOSIS — Z23 Encounter for immunization: Secondary | ICD-10-CM | POA: Insufficient documentation

## 2017-10-23 DIAGNOSIS — Y9301 Activity, walking, marching and hiking: Secondary | ICD-10-CM | POA: Insufficient documentation

## 2017-10-23 DIAGNOSIS — S0001XA Abrasion of scalp, initial encounter: Secondary | ICD-10-CM | POA: Insufficient documentation

## 2017-10-23 DIAGNOSIS — F1910 Other psychoactive substance abuse, uncomplicated: Secondary | ICD-10-CM | POA: Insufficient documentation

## 2017-10-23 LAB — COMPREHENSIVE METABOLIC PANEL
ALK PHOS: 45 U/L (ref 38–126)
ALT: 22 U/L (ref 0–44)
AST: 52 U/L — AB (ref 15–41)
Albumin: 4.4 g/dL (ref 3.5–5.0)
Anion gap: 12 (ref 5–15)
BILIRUBIN TOTAL: 0.9 mg/dL (ref 0.3–1.2)
BUN: 10 mg/dL (ref 6–20)
CALCIUM: 9.1 mg/dL (ref 8.9–10.3)
CO2: 24 mmol/L (ref 22–32)
CREATININE: 0.89 mg/dL (ref 0.44–1.00)
Chloride: 102 mmol/L (ref 98–111)
Glucose, Bld: 98 mg/dL (ref 70–99)
Potassium: 3.5 mmol/L (ref 3.5–5.1)
Sodium: 138 mmol/L (ref 135–145)
TOTAL PROTEIN: 6.6 g/dL (ref 6.5–8.1)

## 2017-10-23 LAB — CBC WITH DIFFERENTIAL/PLATELET
BASOS ABS: 0.1 10*3/uL (ref 0–0.1)
BASOS PCT: 1 %
EOS ABS: 0.1 10*3/uL (ref 0–0.7)
EOS PCT: 2 %
HCT: 34.1 % — ABNORMAL LOW (ref 35.0–47.0)
HEMOGLOBIN: 12.1 g/dL (ref 12.0–16.0)
LYMPHS ABS: 2.6 10*3/uL (ref 1.0–3.6)
Lymphocytes Relative: 31 %
MCH: 32.8 pg (ref 26.0–34.0)
MCHC: 35.4 g/dL (ref 32.0–36.0)
MCV: 92.9 fL (ref 80.0–100.0)
Monocytes Absolute: 0.9 10*3/uL (ref 0.2–0.9)
Monocytes Relative: 11 %
NEUTROS PCT: 55 %
Neutro Abs: 4.7 10*3/uL (ref 1.4–6.5)
PLATELETS: 271 10*3/uL (ref 150–440)
RBC: 3.67 MIL/uL — ABNORMAL LOW (ref 3.80–5.20)
RDW: 13.6 % (ref 11.5–14.5)
WBC: 8.5 10*3/uL (ref 3.6–11.0)

## 2017-10-23 LAB — SALICYLATE LEVEL: Salicylate Lvl: <7 mg/dL (ref 2.8–30.0)

## 2017-10-23 LAB — ETHANOL

## 2017-10-23 LAB — ACETAMINOPHEN LEVEL

## 2017-10-23 MED ORDER — TETANUS-DIPHTH-ACELL PERTUSSIS 5-2.5-18.5 LF-MCG/0.5 IM SUSP
0.5000 mL | Freq: Once | INTRAMUSCULAR | Status: AC
  Administered 2017-10-23: 0.5 mL via INTRAMUSCULAR
  Filled 2017-10-23: qty 0.5

## 2017-10-23 NOTE — ED Notes (Signed)
Pt's girlfriend at bedside. Pt and girlfriend tearful.

## 2017-10-23 NOTE — Progress Notes (Signed)
LCSW checked on patient and she was not waking up. EDRN will notify this worker once patient becomes awake. LCSW will complete assessment.  Kathy Kirchner LCSW

## 2017-10-23 NOTE — ED Notes (Signed)
Pt drowsy; will respond to painful stimuli; shakes head "yes" and "no"; VSS; will not otherwise interact.

## 2017-10-23 NOTE — Progress Notes (Signed)
LCSW attempted to engage with patient and she was not responding and I was unable to awaken her. Got EDP and together charge nurse and doctor was able to arouse her however she went right back to sleep. LCSW will provide several resources to patient once she is awake and can identify her community needs.  [employer] LCSW [phone]

## 2017-10-23 NOTE — ED Notes (Signed)
Pt resting; will open eyes but is unwilling to answer questions.

## 2017-10-23 NOTE — ED Notes (Signed)
Pt awake, oriented, tearful. Lyna Poser at bedside. edp notified and to bedside. Pt requested to be discharged. Discharge paperwork given. CSW to bedside, provided pt and friend resources in the community.

## 2017-10-23 NOTE — ED Triage Notes (Signed)
Pt arrives via ems, pt was observed to fall while walking down the street, pt was reported to have used molly and xanax this am, pt's speech is slurred, pupils are dilated, states that she was with her friend who was doing the same thing, pt is apologetic and asking that we not take her to jail. Pt appears unkept, hands are dirty, bruises and scratches noted over body, pt has an abrasion to the top of her head.

## 2017-10-23 NOTE — ED Provider Notes (Addendum)
Patient is now awake.  Her girlfriend is there.  They both want to go home.  They do not want to stay any longer.  I will let them go.  I will provide them the instructions that were left for them.   Arnaldo Natal, MD 10/23/17 1717 I need to add that patient denies homicidal or suicidal ideation and the girlfriend says she will watch her 24/7.   Arnaldo Natal, MD 10/23/17 [phone]

## 2017-10-23 NOTE — ED Notes (Signed)
Pt tearful; asking to call [facility]; pt given phone and assisted to call [employer].

## 2017-10-23 NOTE — Discharge Instructions (Addendum)
Return to the emergency department immediately for any worsening condition including confusion altered mental status, depression, thoughts of wanting hurt yourself or others.

## 2017-10-23 NOTE — ED Notes (Signed)
Officer made aware of pt's statement and request.
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
## 2017-10-23 NOTE — ED Notes (Signed)
Pt cont to be sleepy, abrasion to scalp cleaned with normal saline, scalp soft to touch, Dr Shaune Pollack aware, call made to ct about head ct ordered

## 2017-10-23 NOTE — ED Notes (Signed)
Dr Shaune Pollack and Claudine from social work at [employer] sleepy at this time and unable to answer questions at this time

## 2017-10-23 NOTE — ED Notes (Signed)
Pt able to answer questions. Officer at bedside with pt and girlfriend. Social work coming to bedside to provide assistance.

## 2017-10-23 NOTE — BH Assessment (Signed)
Assessment Note  Kathy Henry is an 27 y.o. female. Pt currently unable to stay aroused to complete assessment. Not appropriate at this time.   Diagnosis: Altered Mental Status  Past Medical History:  Past Medical History:  Diagnosis Date  . Anxiety   . Bipolar 1 disorder (HCC)   . Depression   . OCD (obsessive compulsive disorder)     Past Surgical History:  Procedure Laterality Date  . FRACTURE SURGERY      Family History: No family history on file.  Social History:  reports that she quit smoking about 2 years ago. She has a 2.50 pack-year smoking history. She has never used smokeless tobacco. She reports that she does not drink alcohol or use drugs.  Additional Social History:     CIWA: CIWA-Ar BP: 124/75 Pulse Rate: 94 COWS:    Allergies: No Known Allergies  Home Medications:  (Not in a hospital admission)  OB/GYN Status:  Patient's last menstrual period was 10/09/2017 (approximate).  General Assessment Data Assessment unable to be completed: Yes(Pt drowsy; will respond to painful stimuli; shakes head "yes)                                                 Advance Directives (For Healthcare) Does Patient Have a Medical Advance Directive?: No Would patient like information on creating a medical advance directive?: No - Patient declined          Disposition:     On Site Evaluation by:   Reviewed with Physician:    Kendal Ghazarian D Kynsli Haapala 10/23/2017 12:07 PM

## 2017-11-16 ENCOUNTER — Telehealth: Payer: Self-pay | Admitting: Pharmacy Technician

## 2017-11-16 NOTE — Telephone Encounter (Signed)
Patient failed to provide 2019 poi.  No additional medication assistance will be provided by [facility] without the required proof of income documentation.  Patient notified by letter.  Anslee Micheletti J. Yaakov Saindon Care Manager [facility] 

## 2017-12-04 ENCOUNTER — Ambulatory Visit (HOSPITAL_COMMUNITY)
Admission: AD | Admit: 2017-12-04 | Discharge: 2017-12-04 | Disposition: A | Discharge status: Home / Self Care | Payer: Self-pay | Source: Other Acute Inpatient Hospital | Attending: Emergency Medicine | Admitting: Emergency Medicine

## 2017-12-04 ENCOUNTER — Emergency Department: Payer: Self-pay

## 2017-12-04 ENCOUNTER — Other Ambulatory Visit: Payer: Self-pay

## 2017-12-04 ENCOUNTER — Emergency Department
Admission: EM | Admit: 2017-12-04 | Discharge: 2017-12-04 | Disposition: A | Discharge status: Home / Self Care | Payer: Self-pay | Attending: Emergency Medicine | Admitting: Emergency Medicine

## 2017-12-04 DIAGNOSIS — W19XXXA Unspecified fall, initial encounter: Secondary | ICD-10-CM

## 2017-12-04 DIAGNOSIS — S22000A Wedge compression fracture of unspecified thoracic vertebra, initial encounter for closed fracture: Secondary | ICD-10-CM

## 2017-12-04 DIAGNOSIS — Y9389 Activity, other specified: Secondary | ICD-10-CM | POA: Insufficient documentation

## 2017-12-04 DIAGNOSIS — F1410 Cocaine abuse, uncomplicated: Secondary | ICD-10-CM | POA: Insufficient documentation

## 2017-12-04 DIAGNOSIS — F1120 Opioid dependence, uncomplicated: Secondary | ICD-10-CM | POA: Insufficient documentation

## 2017-12-04 DIAGNOSIS — W130XXA Fall from, out of or through balcony, initial encounter: Secondary | ICD-10-CM | POA: Insufficient documentation

## 2017-12-04 DIAGNOSIS — F1220 Cannabis dependence, uncomplicated: Secondary | ICD-10-CM | POA: Insufficient documentation

## 2017-12-04 DIAGNOSIS — F419 Anxiety disorder, unspecified: Secondary | ICD-10-CM | POA: Insufficient documentation

## 2017-12-04 DIAGNOSIS — Y929 Unspecified place or not applicable: Secondary | ICD-10-CM | POA: Insufficient documentation

## 2017-12-04 DIAGNOSIS — S2249XA Multiple fractures of ribs, unspecified side, initial encounter for closed fracture: Secondary | ICD-10-CM | POA: Insufficient documentation

## 2017-12-04 DIAGNOSIS — F319 Bipolar disorder, unspecified: Secondary | ICD-10-CM | POA: Insufficient documentation

## 2017-12-04 DIAGNOSIS — Y998 Other external cause status: Secondary | ICD-10-CM | POA: Insufficient documentation

## 2017-12-04 DIAGNOSIS — S22060A Wedge compression fracture of T7-T8 vertebra, initial encounter for closed fracture: Secondary | ICD-10-CM | POA: Insufficient documentation

## 2017-12-04 DIAGNOSIS — Z87891 Personal history of nicotine dependence: Secondary | ICD-10-CM | POA: Insufficient documentation

## 2017-12-04 DIAGNOSIS — S2241XA Multiple fractures of ribs, right side, initial encounter for closed fracture: Secondary | ICD-10-CM

## 2017-12-04 DIAGNOSIS — X58XXXA Exposure to other specified factors, initial encounter: Secondary | ICD-10-CM | POA: Insufficient documentation

## 2017-12-04 LAB — COMPREHENSIVE METABOLIC PANEL
ALK PHOS: 43 U/L (ref 38–126)
ALT: 18 U/L (ref 0–44)
AST: 30 U/L (ref 15–41)
Albumin: 4.3 g/dL (ref 3.5–5.0)
Anion gap: 9 (ref 5–15)
BILIRUBIN TOTAL: 0.8 mg/dL (ref 0.3–1.2)
BUN: 15 mg/dL (ref 6–20)
CALCIUM: 9.2 mg/dL (ref 8.9–10.3)
CO2: 25 mmol/L (ref 22–32)
CREATININE: 0.52 mg/dL (ref 0.44–1.00)
Chloride: 101 mmol/L (ref 98–111)
Glucose, Bld: 115 mg/dL — ABNORMAL HIGH (ref 70–99)
Potassium: 4.1 mmol/L (ref 3.5–5.1)
Sodium: 135 mmol/L (ref 135–145)
Total Protein: 7.4 g/dL (ref 6.5–8.1)

## 2017-12-04 LAB — CBC WITH DIFFERENTIAL/PLATELET
ABS IMMATURE GRANULOCYTES: 0.07 10*3/uL (ref 0.00–0.07)
BASOS PCT: 0 %
Basophils Absolute: 0 10*3/uL (ref 0.0–0.1)
Eosinophils Absolute: 0 10*3/uL (ref 0.0–0.5)
Eosinophils Relative: 0 %
HEMATOCRIT: 38.1 % (ref 36.0–46.0)
HEMOGLOBIN: 12.7 g/dL (ref 12.0–15.0)
Immature Granulocytes: 1 %
LYMPHS ABS: 1.3 10*3/uL (ref 0.7–4.0)
Lymphocytes Relative: 11 %
MCH: 31.1 pg (ref 26.0–34.0)
MCHC: 33.3 g/dL (ref 30.0–36.0)
MCV: 93.2 fL (ref 80.0–100.0)
MONO ABS: 0.9 10*3/uL (ref 0.1–1.0)
MONOS PCT: 7 %
NEUTROS ABS: 9.8 10*3/uL — AB (ref 1.7–7.7)
NRBC: 0 % (ref 0.0–0.2)
Neutrophils Relative %: 81 %
Platelets: 241 10*3/uL (ref 150–400)
RBC: 4.09 MIL/uL (ref 3.87–5.11)
RDW: 13.1 % (ref 11.5–15.5)
WBC: 12.2 10*3/uL — ABNORMAL HIGH (ref 4.0–10.5)

## 2017-12-04 LAB — URINE DRUG SCREEN, QUALITATIVE (ARMC ONLY)
Amphetamines, Ur Screen: NOT DETECTED
BENZODIAZEPINE, UR SCRN: POSITIVE — AB
Barbiturates, Ur Screen: NOT DETECTED
CANNABINOID 50 NG, UR ~~LOC~~: POSITIVE — AB
Cocaine Metabolite,Ur ~~LOC~~: POSITIVE — AB
MDMA (Ecstasy)Ur Screen: NOT DETECTED
METHADONE SCREEN, URINE: NOT DETECTED
Opiate, Ur Screen: NOT DETECTED
Phencyclidine (PCP) Ur S: NOT DETECTED
TRICYCLIC, UR SCREEN: NOT DETECTED

## 2017-12-04 LAB — POCT PREGNANCY, URINE: Preg Test, Ur: NEGATIVE

## 2017-12-04 LAB — ETHANOL: Alcohol, Ethyl (B): <10 mg/dL (ref <10)

## 2017-12-04 MED ORDER — SODIUM CHLORIDE 0.9 % IV SOLN
Freq: Once | INTRAVENOUS | Status: AC
  Administered 2017-12-04: 999 mL/h via INTRAVENOUS

## 2017-12-04 MED ORDER — ONDANSETRON HCL 4 MG/2ML IJ SOLN
4.0000 mg | Freq: Once | INTRAMUSCULAR | Status: AC
  Administered 2017-12-04: 4 mg via INTRAVENOUS
  Filled 2017-12-04: qty 2

## 2017-12-04 MED ORDER — OXYCODONE-ACETAMINOPHEN 5-325 MG PO TABS
2.0000 | ORAL_TABLET | Freq: Once | ORAL | Status: AC
  Administered 2017-12-04: 2 via ORAL
  Filled 2017-12-04: qty 2

## 2017-12-04 MED ORDER — HYDROMORPHONE HCL 1 MG/ML IJ SOLN
1.0000 mg | Freq: Once | INTRAMUSCULAR | Status: AC
  Administered 2017-12-04: 1 mg via INTRAVENOUS
  Filled 2017-12-04: qty 1

## 2017-12-04 NOTE — ED Notes (Signed)
Per Dr Roxan Hockeyobinson, no labs or imaging at this time and pt is appropriate for treatment in [facility].

## 2017-12-04 NOTE — ED Provider Notes (Signed)
[facility] Emergency Department Provider Note       Time seen: ----------------------------------------- 7:55 PM on 12/04/2017 -----------------------------------------   I have reviewed the triage vital signs and the nursing notes.  HISTORY   Chief Complaint Fall    HPI Kathy Henry is a 27 y.o. female with a history of anxiety, bipolar disorder, depression, OCD who presents to the ED for a fall.  Patient states she fell off a balcony last night at some point.  Patient states she tripped and fell, was not feeling bad before the fall.  She states she landed on her back and she has had a difficult time moving and breathing since then.  She denies any other injuries or complaints.  Past Medical History:  Diagnosis Date  . Anxiety   . Bipolar 1 disorder (HCC)   . Depression   . OCD (obsessive compulsive disorder)     Patient Active Problem List   Diagnosis Date Noted  . Borderline personality disorder (HCC) 08/20/2015  . Tobacco use disorder 08/17/2015  . Cannabis use disorder, severe, dependence (HCC) 08/17/2015  . MDD (major depressive disorder), recurrent episode, severe (HCC) 08/17/2015  . Cocaine use disorder, mild, abuse (HCC) 08/17/2015  . 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 Opioid use disorder, moderate, dependence (HCC) 08/17/2015  . Migraines 08/16/2015    Past Surgical History:  Procedure Laterality Date  . FRACTURE SURGERY      Allergies Patient has no known allergies.  Social History Social History   Tobacco Use  . Smoking status: Former Smoker    Packs/day: 0.50    Years: 5.00    Pack years: 2.50    Last attempt to quit: 07/18/2015    Years since quitting: 2.3  . Smokeless tobacco: Never Used  Substance Use Topics  . Alcohol use: No  . Drug use: No   Review of Systems Constitutional: Negative for fever. Cardiovascular: Positive for chest pain Respiratory: Positive for difficulty  breathing Gastrointestinal: Negative for abdominal pain, vomiting and diarrhea. Musculoskeletal: Positive for back pain Skin: Negative for rash. Neurological: Negative for headaches, focal weakness or numbness.  All systems negative/normal/unremarkable except as stated in the HPI  ____________________________________________   PHYSICAL EXAM:  VITAL SIGNS: ED Triage Vitals  Enc Vitals Group     BP 12/04/17 1943 121/86     Pulse Rate 12/04/17 1943 99     Resp 12/04/17 1943 17     Temp 12/04/17 1943 97.6 F (36.4 C)     Temp Source 12/04/17 1943 Oral     SpO2 12/04/17 1943 100 %     Weight 12/04/17 1940 120 lb (54.4 kg)     Height 12/04/17 1940 5\' 8"  (1.727 m)     Head Circumference --      Peak Flow --      Pain Score 12/04/17 1940 10     Pain Loc --      Pain Edu? --      Excl. in GC? --    Constitutional: Alert and oriented.  Anxious, no distress Eyes: Conjunctivae are normal. Normal extraocular movements. ENT   Head: Normocephalic and atraumatic.   Nose: No congestion/rhinnorhea.   Mouth/Throat: Mucous membranes are moist.   Neck: No stridor. Cardiovascular: Normal rate, regular rhythm. No murmurs, rubs, or gallops. Respiratory: Normal respiratory effort without tachypnea nor retractions. Breath sounds are clear and equal bilaterally. No wheezes/rales/rhonchi. Gastrointestinal: Soft and nontender. Normal bowel sounds Musculoskeletal: Nontender with normal range  of motion in extremities.  Negative cross and straight leg raise examination.  Chest wall tenderness is noted, diffuse spinous process tenderness is noted Neurologic:  Normal speech and language. No gross focal neurologic deficits are appreciated.  Skin:  Skin is warm, dry and intact. No rash noted. Psychiatric: Anxious mood and affect  Labs Reviewed  CBC WITH DIFFERENTIAL/PLATELET - Abnormal; Notable for the following components:      Result Value   WBC 12.2 (*)    Neutro Abs 9.8 (*)    All  other components within normal limits  COMPREHENSIVE METABOLIC PANEL - Abnormal; Notable for the following components:   Glucose, Bld 115 (*)    All other components within normal limits  URINE DRUG SCREEN, QUALITATIVE ([facility] ONLY) - Abnormal; Notable for the following components:   Cocaine Metabolite,Ur [place] POSITIVE (*)    Cannabinoid 50 Ng, Ur [place] POSITIVE (*)    Benzodiazepine, Ur Scrn POSITIVE (*)    All other components within normal limits  ETHANOL  POCT PREGNANCY, URINE   s ____________________________________________  ED COURSE:  As part of my medical decision making, I reviewed the following data within the electronic MEDICAL RECORD NUMBER History obtained from family if available, nursing notes, old chart and ekg, as well as notes from prior ED visits. Patient presented for a fall that occurred last night, we will assess with imaging as indicated at this time.   Procedures ____________________________________________   RADIOLOGY Images were viewed by me  Chest x-ray, thoracic spine x-ray, lumbar spine x-ray IMPRESSION: Severe T9 vertebral body compression fracture with approximately 80% anterior height loss and focal kyphosis centered at T9. Fracture of posterior elements of T9 with widening of the interspinous space. Findings are consistent with acute unstable fracture.  Cortical irregularity along the superior posterior margin of the T8 vertebral body concerning for a fracture.  Mild anterior T10 vertebral body compression fracture.  Critical Value/emergent results were called by telephone at the time of interpretation on 12/04/2017 at 9:23 pm to Dr. Daryel NovemberJONATHAN  , who verbally acknowledged these results. CT thoracic spine IMPRESSION: 1. T9 acute 3 column burst fracture with severe anterior loss of vertebral body height and comminution of posterior elements. Associate focal kyphosis and 2-3 mm bony retropulsion of the vertebral body with mild spinal canal  stenosis. 2. T8 mild compression fracture of the left side of the vertebral body with comminuted fractures extending into the left pedicle and left transverse process. 3. T10 superior endplate fracture with minimal loss of vertebral body height. Separate mildly displaced left T10 transverse process fracture. 4. Left 6-9 posterior rib fractures. 5. Trace left pneumothorax.  These results were called by telephone at the time of interpretation on 12/04/2017 at 9:57 pm to Dr. Daryel NovemberJONATHAN  , who verbally acknowledged these results. ____________________________________________  CRITICAL CARE Performed by: Ulice DashJohnathan E    Total critical care time: 30 minutes  Critical care time was exclusive of separately billable procedures and treating other patients.  Critical care was necessary to treat or prevent imminent or life-threatening deterioration.  Critical care was time spent personally by me on the following activities: development of treatment plan with patient and/or surrogate as well as nursing, discussions with consultants, evaluation of patient's response to treatment, examination of patient, obtaining history from patient or surrogate, ordering and performing treatments and interventions, ordering and review of laboratory studies, ordering and review of radiographic studies, pulse oximetry and re-evaluation of patient's condition.   DIFFERENTIAL DIAGNOSIS   Fall, contusion, fracture, pneumothorax  FINAL ASSESSMENT AND PLAN  Fall, thoracic compression fractures, right-sided rib fractures, trace pneumothorax   Plan: The patient had presented for back pain from a fall that occurred last night. Patient's labs did reveal cocaine, benzodiazepines and THC in her urine drug screen with mild leukocytosis. Patient's imaging revealed numerous abnormalities including T9 3 column burst fracture with severe loss of height, T8 compression fracture, T10 compression fracture and left  6 through 9 posterior rib fractures as well as a trace left pneumothorax.  Due to the extent of her injury she will be transferred to [facility] for further care by the trauma team.  Patient will be kept flat on her back until transfer.   Ulice Dash, MD   Note: This note was generated in part or whole with voice recognition software. Voice recognition is usually quite accurate but there are transcription errors that can and very often do occur. I apologize for any typographical errors that were not detected and corrected.     Emily Filbert, MD 12/04/17 2212

## 2017-12-04 NOTE — ED Notes (Signed)
Pt reports she fell from a balcony yesterday reports she tripped and fell reports about a story tall, reports she did not call EMS reports she did not wanted to ome took Ibuprofen yesterday for pain, reports today she has not taken anything pt reports pain has not changed since yesterday

## 2017-12-04 NOTE — ED Triage Notes (Signed)
Pt arrives to ED via POV from home s/p "falling off a balcony". Pt reports fall was approximately 1 story. Pt denies LOC or head injury. Pt reports lower back pain. Pt reports she "tripped and fell" and was not a result of lightheadedness, dizziness, or syncope. CMS intact in bilateral lower extremities.

## 2017-12-04 NOTE — ED Notes (Signed)
Given report to HarrisonJason, RN [facility]

## 2017-12-04 NOTE — ED Notes (Signed)
Pt to restroom

## 2017-12-05 MED ORDER — NALOXONE HCL 0.4 MG/ML IJ SOLN
0.40 | INTRAMUSCULAR | Status: DC
Start: ? — End: 2017-12-05

## 2017-12-05 MED ORDER — DOCUSATE SODIUM 100 MG PO CAPS
100.00 | ORAL_CAPSULE | ORAL | Status: DC
Start: 2017-12-06 — End: 2017-12-05

## 2017-12-05 MED ORDER — LACTATED RINGERS IV SOLN
75.00 | INTRAVENOUS | Status: DC
Start: ? — End: 2017-12-05

## 2017-12-05 MED ORDER — GENERIC EXTERNAL MEDICATION
5.00 | Status: DC
Start: ? — End: 2017-12-05

## 2017-12-05 MED ORDER — ACETAMINOPHEN 500 MG PO TABS
1000.00 | ORAL_TABLET | ORAL | Status: DC
Start: 2017-12-05 — End: 2017-12-05

## 2017-12-05 MED ORDER — LIDOCAINE 5 % EX PTCH
1.00 | MEDICATED_PATCH | CUTANEOUS | Status: DC
Start: 2017-12-06 — End: 2017-12-05

## 2017-12-05 MED ORDER — INFLUENZA VAC SPLIT QUAD 0.5 ML IM SUSY
0.50 | PREFILLED_SYRINGE | INTRAMUSCULAR | Status: DC
Start: ? — End: 2017-12-05

## 2017-12-05 MED ORDER — HYDROMORPHONE HCL 1 MG/ML IJ SOLN
0.50 | INTRAMUSCULAR | Status: DC
Start: ? — End: 2017-12-05

## 2017-12-05 MED ORDER — GABAPENTIN 100 MG PO CAPS
100.00 | ORAL_CAPSULE | ORAL | Status: DC
Start: 2017-12-05 — End: 2017-12-05

## 2017-12-05 MED ORDER — CYCLOBENZAPRINE HCL 10 MG PO TABS
5.00 | ORAL_TABLET | ORAL | Status: DC
Start: ? — End: 2017-12-05

## 2017-12-05 MED ORDER — ONDANSETRON HCL 4 MG/2ML IJ SOLN
4.00 | INTRAMUSCULAR | Status: DC
Start: ? — End: 2017-12-05

## 2017-12-05 MED ORDER — GENERIC EXTERNAL MEDICATION
Status: DC
Start: ? — End: 2017-12-05

## 2017-12-08 MED FILL — Hydromorphone HCl Inj 2 MG/ML: INTRAMUSCULAR | Qty: 1 | Status: AC

## 2020-03-28 ENCOUNTER — Encounter: Payer: Self-pay | Admitting: Emergency Medicine

## 2020-03-28 ENCOUNTER — Other Ambulatory Visit: Payer: Self-pay

## 2020-03-28 ENCOUNTER — Emergency Department
Admission: EM | Admit: 2020-03-28 | Discharge: 2020-03-28 | Disposition: A | Discharge status: Home / Self Care | Payer: Medicaid Other | Attending: Emergency Medicine | Admitting: Emergency Medicine

## 2020-03-28 ENCOUNTER — Emergency Department: Payer: Medicaid Other

## 2020-03-28 DIAGNOSIS — Z87891 Personal history of nicotine dependence: Secondary | ICD-10-CM | POA: Insufficient documentation

## 2020-03-28 DIAGNOSIS — M546 Pain in thoracic spine: Secondary | ICD-10-CM | POA: Diagnosis not present

## 2020-03-28 DIAGNOSIS — G8929 Other chronic pain: Secondary | ICD-10-CM | POA: Diagnosis not present

## 2020-03-28 DIAGNOSIS — Z981 Arthrodesis status: Secondary | ICD-10-CM

## 2020-03-28 DIAGNOSIS — M5442 Lumbago with sciatica, left side: Secondary | ICD-10-CM | POA: Insufficient documentation

## 2020-03-28 DIAGNOSIS — M545 Low back pain, unspecified: Secondary | ICD-10-CM | POA: Diagnosis not present

## 2020-03-28 DIAGNOSIS — M5441 Lumbago with sciatica, right side: Secondary | ICD-10-CM | POA: Insufficient documentation

## 2020-03-28 MED ORDER — KETOROLAC TROMETHAMINE 30 MG/ML IJ SOLN
30.0000 mg | Freq: Once | INTRAMUSCULAR | Status: AC
  Administered 2020-03-28: 30 mg via INTRAMUSCULAR
  Filled 2020-03-28: qty 1

## 2020-03-28 MED ORDER — METHOCARBAMOL 500 MG PO TABS: 500.0000 mg | ORAL_TABLET | Freq: Four times a day (QID) | ORAL | 2 refills | Status: DC

## 2020-03-28 MED ORDER — OXYCODONE-ACETAMINOPHEN 5-325 MG PO TABS: 1.0000 | ORAL_TABLET | Freq: Four times a day (QID) | ORAL | 0 refills | Status: DC | PRN

## 2020-03-28 MED ORDER — HYDROCODONE-ACETAMINOPHEN 5-325 MG PO TABS
1.0000 | ORAL_TABLET | Freq: Once | ORAL | Status: AC
  Administered 2020-03-28: 1 via ORAL
  Filled 2020-03-28: qty 1

## 2020-03-28 MED ORDER — ORPHENADRINE CITRATE 30 MG/ML IJ SOLN
60.0000 mg | Freq: Once | INTRAMUSCULAR | Status: AC
  Administered 2020-03-28: 60 mg via INTRAMUSCULAR
  Filled 2020-03-28: qty 2

## 2020-03-28 MED ORDER — MELOXICAM 15 MG PO TABS: 15.0000 mg | ORAL_TABLET | Freq: Every day | ORAL | 2 refills | Status: DC
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
## 2020-05-03 ENCOUNTER — Ambulatory Visit (INDEPENDENT_AMBULATORY_CARE_PROVIDER_SITE_OTHER): Payer: 59 | Admitting: Nurse Practitioner

## 2020-05-03 ENCOUNTER — Encounter: Payer: Self-pay | Admitting: Nurse Practitioner

## 2020-05-03 ENCOUNTER — Other Ambulatory Visit: Payer: Self-pay

## 2020-05-03 VITALS — BP 124/83 | HR 103 | Temp 98.9°F | Ht 67.0 in | Wt 116.2 lb

## 2020-05-03 DIAGNOSIS — F17200 Nicotine dependence, unspecified, uncomplicated: Secondary | ICD-10-CM

## 2020-05-03 DIAGNOSIS — F1120 Opioid dependence, uncomplicated: Secondary | ICD-10-CM

## 2020-05-03 DIAGNOSIS — F332 Major depressive disorder, recurrent severe without psychotic features: Secondary | ICD-10-CM

## 2020-05-03 DIAGNOSIS — M546 Pain in thoracic spine: Secondary | ICD-10-CM | POA: Diagnosis not present

## 2020-05-03 MED ORDER — GABAPENTIN 300 MG PO CAPS: 300.0000 mg | ORAL_CAPSULE | Freq: Three times a day (TID) | ORAL | 0 refills | Status: DC

## 2020-05-03 MED ORDER — METHOCARBAMOL 500 MG PO TABS: 500.0000 mg | ORAL_TABLET | Freq: Four times a day (QID) | ORAL | 0 refills | Status: DC

## 2020-05-03 NOTE — Assessment & Plan Note (Addendum)
Chronic.  Uncontrolled.  Will try Gbapentin 300mg  TID and Robaxin to help with patient's pain. Referral to pain management placed. Follow up in one month for reevaluation.

## 2020-05-03 NOTE — Assessment & Plan Note (Signed)
Reports she is not currently taking any pain medications, benzos or using Cocaine.
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
## 2020-05-03 NOTE — Assessment & Plan Note (Signed)
No longer smokes cigarettes.

## 2020-05-09 ENCOUNTER — Telehealth: Payer: Self-pay

## 2020-05-09 NOTE — Telephone Encounter (Signed)
Spoke with patient and let them know I have sent to another location

## 2020-05-09 NOTE — Telephone Encounter (Signed)
Copied from CRM [id number]. Topic: Appointment Scheduling - Scheduling Inquiry for Clinic >> May 09, 2020 10:20 AM Kathy Henry wrote: Patient requesting to speak with the referral coordinator regarding orthopedic not accepting her insurance and would like a follow up call today

## 2020-05-22 ENCOUNTER — Telehealth: Payer: Self-pay | Admitting: Nurse Practitioner

## 2020-05-22 NOTE — Telephone Encounter (Signed)
Pt received referral for psychiatry and has been trying to contact the location/ she stated she gets transferred around and speaks to no ne to schedule an actual appointment/ Pt wants to know if office can reach out to them or send a new referral to another location asap/Pt stated she really needs this appt / please advise

## 2020-05-23 NOTE — Telephone Encounter (Signed)
Left detailed message for patient made her aware of Karen's recommendations. Advised patient to give our office a call if she has any questions.

## 2020-06-04 ENCOUNTER — Other Ambulatory Visit: Payer: Self-pay

## 2020-06-04 ENCOUNTER — Encounter: Payer: Self-pay | Admitting: Nurse Practitioner

## 2020-06-04 ENCOUNTER — Ambulatory Visit (INDEPENDENT_AMBULATORY_CARE_PROVIDER_SITE_OTHER): Payer: 59 | Admitting: Nurse Practitioner

## 2020-06-04 VITALS — BP 122/86 | HR 76 | Temp 98.3°F | Ht 67.01 in | Wt 117.4 lb

## 2020-06-04 DIAGNOSIS — F332 Major depressive disorder, recurrent severe without psychotic features: Secondary | ICD-10-CM

## 2020-06-04 DIAGNOSIS — M546 Pain in thoracic spine: Secondary | ICD-10-CM | POA: Diagnosis not present

## 2020-06-04 DIAGNOSIS — Z599 Problem related to housing and economic circumstances, unspecified: Secondary | ICD-10-CM

## 2020-06-04 DIAGNOSIS — F603 Borderline personality disorder: Secondary | ICD-10-CM

## 2020-06-04 DIAGNOSIS — W130XXS Fall from, out of or through balcony, sequela: Secondary | ICD-10-CM

## 2020-06-04 DIAGNOSIS — G4700 Insomnia, unspecified: Secondary | ICD-10-CM

## 2020-06-04 MED ORDER — GABAPENTIN 300 MG PO CAPS: 300.0000 mg | ORAL_CAPSULE | Freq: Three times a day (TID) | ORAL | 0 refills | Status: DC

## 2020-06-04 MED ORDER — QUETIAPINE FUMARATE 25 MG PO TABS: 25.0000 mg | ORAL_TABLET | Freq: Every day | ORAL | 0 refills | Status: DC

## 2020-06-04 NOTE — Assessment & Plan Note (Signed)
Continuing to work with patient's health insurance and referral coordinator to help find a psychiatrist.

## 2020-06-04 NOTE — Progress Notes (Signed)
BP 122/86   Pulse 76   Temp 98.3 F (36.8 C) (Oral)   Ht 5' 7.01" (1.702 m)   Wt 117 lb 6.4 oz (53.3 kg)   LMP 05/17/2020   SpO2 99%   BMI 18.38 kg/m    Subjective:    Patient ID: Kathy Henry, female    DOB: [birth year], 29 y.o.   MRN: [id number]  HPI: Kathy Henry is a 30 y.o. female  Chief Complaint  Patient presents with  . Depression    Patient had referral to [facility], they did not take her ins. Has new appt with new specialist on 06/14/20  . Anxiety    DEPRESSION Patient has thoughts of suicide.  Patient denies a plan.  Patient states that she has not been successful with finding someone who takes her insurance.  Patient feels like she has a long way to get in order to get her health in a better place. She has not tried to commit suicide.  Has had a lot of difficulty with sleep.  Flowsheet Row Office Visit from 06/04/2020 in [facility]  PHQ-9 Total Score 26     GAD 7 : Generalized Anxiety Score 06/04/2020 05/03/2020  Nervous, Anxious, on Edge 3 3  Control/stop worrying 3 3  Worry too much - different things 3 3  Trouble relaxing 3 3  Restless 3 3  Easily annoyed or irritable 3 3  Afraid - awful might happen 3 3  Total GAD 7 Score 21 21  Anxiety Difficulty Extremely difficult Extremely difficult    CHRONIC PAIN Patient has an appointment with a neurosurgeon on 06/14/2020. Patient is anxious about appointment.  It will be a video visit which she is not very comfortable with due having PTSD.  However, she is glad that she may be getting some help soon.    Relevant past medical, surgical, family and social history reviewed and updated as indicated. Interim medical history since our last visit reviewed. Allergies and medications reviewed and updated.  Review of Systems  Per HPI unless specifically indicated above     Objective:    BP 122/86   Pulse 76   Temp 98.3 F (36.8 C) (Oral)   Ht 5' 7.01" (1.702 m)   Wt 117 lb 6.4 oz (53.3 kg)   LMP  05/17/2020   SpO2 99%   BMI 18.38 kg/m   Wt Readings from Last 3 Encounters:  06/04/20 117 lb 6.4 oz (53.3 kg)  05/03/20 116 lb 3.2 oz (52.7 kg)  03/28/20 120 lb (54.4 kg)    Physical Exam Vitals and nursing note reviewed.  Constitutional:      General: She is not in acute distress.    Appearance: Normal appearance. She is normal weight. She is not ill-appearing, toxic-appearing or diaphoretic.  HENT:     Head: Normocephalic.     Right Ear: External ear normal.     Left Ear: External ear normal.     Nose: Nose normal.     Mouth/Throat:     Mouth: Mucous membranes are moist.     Pharynx: Oropharynx is clear.  Eyes:     General:        Right eye: No discharge.        Left eye: No discharge.     Extraocular Movements: Extraocular movements intact.     Conjunctiva/sclera: Conjunctivae normal.     Pupils: Pupils are equal, round, and reactive to light.  Cardiovascular:     Rate  and Rhythm: Normal rate and regular rhythm.     Heart sounds: No murmur heard.   Pulmonary:     Effort: Pulmonary effort is normal. No respiratory distress.     Breath sounds: Normal breath sounds. No wheezing or rales.  Musculoskeletal:     Cervical back: Normal range of motion and neck supple.  Skin:    General: Skin is warm and dry.     Capillary Refill: Capillary refill takes less than 2 seconds.  Neurological:     General: No focal deficit present.     Mental Status: She is alert and oriented to person, place, and time. Mental status is at baseline.  Psychiatric:        Mood and Affect: Mood normal.        Behavior: Behavior normal.        Thought Content: Thought content normal.        Judgment: Judgment normal.     Results for orders placed or performed during the hospital encounter of 12/04/17  CBC with Differential/Platelet  Result Value Ref Range   WBC 12.2 (H) 4.0 - 10.5 K/uL   RBC 4.09 3.87 - 5.11 MIL/uL   Hemoglobin 12.7 12.0 - 15.0 g/dL   HCT 67.8 93.8 - 10.1 %   MCV 93.2  80.0 - 100.0 fL   MCH 31.1 26.0 - 34.0 pg   MCHC 33.3 30.0 - 36.0 g/dL   RDW 75.1 02.5 - 85.2 %   Platelets 241 150 - 400 K/uL   nRBC 0.0 0.0 - 0.2 %   Neutrophils Relative % 81 %   Neutro Abs 9.8 (H) 1.7 - 7.7 K/uL   Lymphocytes Relative 11 %   Lymphs Abs 1.3 0.7 - 4.0 K/uL   Monocytes Relative 7 %   Monocytes Absolute 0.9 0.1 - 1.0 K/uL   Eosinophils Relative 0 %   Eosinophils Absolute 0.0 0.0 - 0.5 K/uL   Basophils Relative 0 %   Basophils Absolute 0.0 0.0 - 0.1 K/uL   Immature Granulocytes 1 %   Abs Immature Granulocytes 0.07 0.00 - 0.07 K/uL  Comprehensive metabolic panel  Result Value Ref Range   Sodium 135 135 - 145 mmol/L   Potassium 4.1 3.5 - 5.1 mmol/L   Chloride 101 98 - 111 mmol/L   CO2 25 22 - 32 mmol/L   Glucose, Bld 115 (H) 70 - 99 mg/dL   BUN 15 6 - 20 mg/dL   Creatinine, Ser 7.78 0.44 - 1.00 mg/dL   Calcium 9.2 8.9 - 24.2 mg/dL   Total Protein 7.4 6.5 - 8.1 g/dL   Albumin 4.3 3.5 - 5.0 g/dL   AST 30 15 - 41 U/L   ALT 18 0 - 44 U/L   Alkaline Phosphatase 43 38 - 126 U/L   Total Bilirubin 0.8 0.3 - 1.2 mg/dL   GFR calc non Af Amer >60 >60 mL/min   GFR calc Af Amer >60 >60 mL/min   Anion gap 9 5 - 15  Urine Drug Screen, Qualitative ([facility] only)  Result Value Ref Range   Tricyclic, Ur Screen NONE DETECTED NONE DETECTED   Amphetamines, Ur Screen NONE DETECTED NONE DETECTED   MDMA (Ecstasy)Ur Screen NONE DETECTED NONE DETECTED   Cocaine Metabolite,Ur [place] POSITIVE (A) NONE DETECTED   Opiate, Ur Screen NONE DETECTED NONE DETECTED   Phencyclidine (PCP) Ur S NONE DETECTED NONE DETECTED   Cannabinoid 50 Ng, Ur [place] POSITIVE (A) NONE DETECTED   Barbiturates, Ur Screen NONE DETECTED  NONE DETECTED   Benzodiazepine, Ur Scrn POSITIVE (A) NONE DETECTED   Methadone Scn, Ur NONE DETECTED NONE DETECTED  Ethanol  Result Value Ref Range   Alcohol, Ethyl (B) <10 <10 mg/dL  Pregnancy, urine POC  Result Value Ref Range   Preg Test, Ur NEGATIVE NEGATIVE      Assessment &  Plan:   Problem List Items Addressed This Visit      Other   MDD (major depressive disorder), recurrent episode, severe (HCC) - Primary    Chronic.  Uncontrolled.  Has had thoughts of suicide but does not have a plan. Information given for patient on Crisis Hotline, seeking help in the ER, and returning to the clinic if thoughts increase.  Continuing to work with referral coordinator to help patient find a psychiatrist.  Return to clinic in 1 month.      Borderline personality disorder (HCC)    Continuing to work with patient's health insurance and referral coordinator to help find a psychiatrist.       Fall from balcony    Patient has upcoming appointment with Neurosurgery on 06/14/2020.  Will follow up with patient after appointment.       Thoracic spine pain    Patient has upcoming appointment with Neurosurgery on 06/14/2020.  Will follow up with patient after appointment.       Relevant Medications   gabapentin (NEURONTIN) 300 MG capsule   Insomnia    Patient given Seroquel 25mg  to help with sleep due to not being able to get into psychiatry. Discussed side effects and benefits of medication discussed.  Discussed with patient that it could worsen depression/anxiety. If so, recommend stopping the medication.            Follow up plan: Return in about 1 month (around 07/05/2020) for Depression/Anxiety FU.

## 2020-06-04 NOTE — Assessment & Plan Note (Signed)
Chronic.  Uncontrolled.  Has had thoughts of suicide but does not have a plan. Information given for patient on Crisis Hotline, seeking help in the ER, and returning to the clinic if thoughts increase.  Continuing to work with referral coordinator to help patient find a psychiatrist.  Return to clinic in 1 month.

## 2020-06-04 NOTE — Assessment & Plan Note (Signed)
Patient given Seroquel 25mg  to help with sleep due to not being able to get into psychiatry. Discussed side effects and benefits of medication discussed.  Discussed with patient that it could worsen depression/anxiety. If so, recommend stopping the medication.

## 2020-06-04 NOTE — Assessment & Plan Note (Signed)
Patient has upcoming appointment with Neurosurgery on 06/14/2020.  Will follow up with patient after appointment.  

## 2020-06-04 NOTE — Patient Instructions (Signed)
Suicidal Feelings: How to Help Yourself Suicide is when you end your own life. There are many things you can do to help yourself feel better when struggling with these feelings. Many services and people are available to support you and others who struggle with similar feelings.  If you ever feel like you may hurt yourself or others, or have thoughts about taking your own life, get help right away. To get help:  Call your local emergency services (911 in the [place]  The [employer]'s health and human services helpline (211 in the [place]  Go to your nearest emergency department.  Call a suicide hotline to speak with a trained counselor. The following suicide hotlines are available in the [place]: ? 1-800-273-TALK (1-800-273-8255). ? 1-800-SUICIDE (1-800-784-2433). ? 1-888-628-9454. This is a hotline for Spanish speakers. ? 1-800-799-4889. This is a hotline for TTY users. ? 1-866-4-U-TREVOR (1-866-488-7386). This is a hotline for lesbian, gay, bisexual, transgender, or questioning youth. ? For a list of hotlines in [place], visit www.suicide.org/hotlines/international/[place]-suicide-hotlines.html  Contact a crisis center or a local suicide prevention center. To find a crisis center or suicide prevention center: ? Call your local hospital, clinic, community service organization, mental health center, social service provider, or health department. Ask for help with connecting to a crisis center. ? For a list of crisis centers in the [place], visit: suicidepreventionlifeline.org ? For a list of crisis centers in [place], visit: suicideprevention.ca How to help yourself feel better  Promise yourself that you will not do anything extreme when you have suicidal feelings. Remember the times you have felt hopeful. Many people have gotten through suicidal thoughts and feelings, and you can too. If you have had these feelings before, remind yourself that you can get through them again.  Let  family, friends, teachers, or counselors know how you are feeling. Try not to separate yourself from those who care about you and want to help you. Talk with someone every day, even if you do not feel sociable. Face-to-face conversation is best to help them understand your feelings.  Contact a mental health care provider and work with this person regularly.  Make a safety plan that you can follow during a crisis. Include phone numbers of suicide prevention hotlines, mental health professionals, and trusted friends and family members you can call during an emergency. Save these numbers on your phone.  If you are thinking of taking a lot of medicine, give your medicine to someone who can give it to you as prescribed. If you are on antidepressants and are concerned you will overdose, tell your health care provider so that he or she can give you safer medicines.  Try to stick to your routines and follow a schedule every day. Make self-care a priority.  Make a list of realistic goals, and cross them off when you achieve them. Accomplishments can give you a sense of worth.  Wait until you are feeling better before doing things that you find difficult or unpleasant.  Do things that you have always enjoyed to take your mind off your feelings. Try reading a book, or listening to or playing music. Spending time outside, in nature, may help you feel better.   Follow these instructions at home:  Visit your primary health care provider every year for a checkup.  Work with a mental health care provider as needed.  Eat a well-balanced diet, and eat regular meals.  Get plenty of rest.  Exercise if you are able. Just 30 minutes of   exercise each day can help you feel better.  Take over-the-counter and prescription medicines only as told by your health care provider. Ask your mental health care provider about the possible side effects of any medicines you are taking.  Do not use alcohol or drugs, and  remove these substances from your home.  Remove weapons, poisons, knives, and other deadly items from your home.   General recommendations  Keep your living space well lit.  When you are feeling well, write yourself a letter with tips and support that you can read when you are not feeling well.  Remember that life's difficulties can be sorted out with help. Conditions can be treated, and you can learn behaviors and ways of thinking that will help you. Where to find more information  National Suicide Prevention Lifeline: www.suicidepreventionlifeline.org  Hopeline: www.hopeline.com  American Foundation for Suicide Prevention: www.afsp.org  The Trevor Project (for lesbian, gay, bisexual, transgender, or questioning youth): www.thetrevorproject.org  National Institute of Mental Health: https://www.nimh.nih.gov/health/topics/suicide-prevention Contact a health care provider if:  You feel as though you are a burden to others.  You feel agitated, angry, vengeful, or have extreme mood swings.  You have withdrawn from family and friends. Get help right away if:  You are talking about suicide or wishing to die.  You start making plans for how to commit suicide.  You feel that you have no reason to live.  You start making plans for putting your affairs in order, saying goodbye, or giving your possessions away.  You feel guilt, shame, or unbearable pain, and it seems like there is no way out.  You are frequently using drugs or alcohol.  You are engaging in risky behaviors that could lead to death. If you have any of these symptoms, get help right away. Call emergency services, go to your nearest emergency department or crisis center, or call a suicide crisis helpline. Summary  Suicide is when you take your own life.  Promise yourself that you will not do anything extreme when you have suicidal feelings.  Let family, friends, teachers, or counselors know how you are  feeling.  Get help right away if you start making plans for how to commit suicide. This information is not intended to replace advice given to you by your health care provider. Make sure you discuss any questions you have with your health care provider. Document Revised: 09/23/2019 Document Reviewed: 09/23/2019 Elsevier Patient Education  2021 Elsevier Inc.  

## 2020-06-04 NOTE — Assessment & Plan Note (Signed)
Patient has upcoming appointment with Neurosurgery on 06/14/2020.  Will follow up with patient after appointment.

## 2020-06-05 ENCOUNTER — Telehealth: Payer: Self-pay | Admitting: *Deleted

## 2020-06-05 NOTE — Chronic Care Management (AMB) (Signed)
  Care Management   Note  06/05/2020 Name: SARISSA DERN MRN: [id number] DOB: 18-Apr-1990  AMYJO MIZRACHI is a 30 y.o. year old female who is a primary care patient of Larae Grooms, NP. I reached out to Clare Gandy by phone today in response to a referral sent by Ms. Denver Faster PCP, Larae Grooms, NP.    Ms. Behanna was given information about care management services today including:  1. Care management services include personalized support from designated clinical staff supervised by her physician, including individualized plan of care and coordination with other care providers 2. 24/7 contact phone numbers for assistance for urgent and routine care needs. 3. The patient may stop care management services at any time by phone call to the office staff.  Patient agreed to services and verbal consent obtained.   Follow up plan: Telephone appointment with care management team member scheduled for:06/07/2020  [facility] Guide, Embedded Care Coordination [facility] Management

## 2020-06-05 NOTE — Addendum Note (Signed)
Addended by: Larae Grooms on: 06/05/2020 11:06 AM   Modules accepted: Orders

## 2020-06-07 ENCOUNTER — Telehealth: Payer: 59

## 2020-06-08 ENCOUNTER — Ambulatory Visit: Payer: 59 | Admitting: Licensed Clinical Social Worker

## 2020-06-11 NOTE — Patient Instructions (Signed)
Visit Information  Goals Addressed            This Visit's Progress   . Mangement of Depression and Anxiety Symptoms   On track    Patient Goals/Self-Care Activities: Over the next 120 days .  Attend all scheduled appointments . Continue compliance with med management . Contact office with any questions or concerns . - avoid negative self-talk . - practice relaxation or meditation daily . - talk about feelings with a friend, family or spiritual advisor . - practice positive thinking and self-talk       Patient verbalizes understanding of instructions provided today and agrees to view in MyChart.   Telephone follow up appointment with care management team member scheduled for:07/02/20  Jenel Lucks, MSW, LCSW [facility] Care Management [facility]  [facility] [email or web address] Phone [phone] 12:02 PM
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
## 2020-06-14 DIAGNOSIS — G959 Disease of spinal cord, unspecified: Secondary | ICD-10-CM | POA: Diagnosis not present

## 2020-06-14 DIAGNOSIS — Z7689 Persons encountering health services in other specified circumstances: Secondary | ICD-10-CM | POA: Diagnosis not present

## 2020-06-14 DIAGNOSIS — S24103D Unspecified injury at T7-T10 level of thoracic spinal cord, subsequent encounter: Secondary | ICD-10-CM | POA: Diagnosis not present

## 2020-06-22 ENCOUNTER — Other Ambulatory Visit: Payer: Self-pay | Admitting: Neurosurgery

## 2020-06-22 ENCOUNTER — Other Ambulatory Visit (HOSPITAL_COMMUNITY): Payer: Self-pay | Admitting: Neurosurgery

## 2020-06-22 DIAGNOSIS — S24103D Unspecified injury at T7-T10 level of thoracic spinal cord, subsequent encounter: Secondary | ICD-10-CM

## 2020-06-22 DIAGNOSIS — G959 Disease of spinal cord, unspecified: Secondary | ICD-10-CM

## 2020-07-02 ENCOUNTER — Telehealth: Payer: Self-pay

## 2020-07-05 ENCOUNTER — Other Ambulatory Visit: Payer: Self-pay

## 2020-07-05 ENCOUNTER — Ambulatory Visit (INDEPENDENT_AMBULATORY_CARE_PROVIDER_SITE_OTHER): Payer: 59 | Admitting: Nurse Practitioner

## 2020-07-05 ENCOUNTER — Encounter: Payer: Self-pay | Admitting: Nurse Practitioner

## 2020-07-05 VITALS — BP 118/78 | HR 76 | Temp 99.0°F | Ht 66.38 in | Wt 122.4 lb

## 2020-07-05 DIAGNOSIS — Z7689 Persons encountering health services in other specified circumstances: Secondary | ICD-10-CM | POA: Diagnosis not present

## 2020-07-05 DIAGNOSIS — F332 Major depressive disorder, recurrent severe without psychotic features: Secondary | ICD-10-CM

## 2020-07-05 DIAGNOSIS — Z599 Problem related to housing and economic circumstances, unspecified: Secondary | ICD-10-CM | POA: Diagnosis not present

## 2020-07-05 DIAGNOSIS — M546 Pain in thoracic spine: Secondary | ICD-10-CM | POA: Diagnosis not present

## 2020-07-05 MED ORDER — GABAPENTIN 300 MG PO CAPS: 300.0000 mg | ORAL_CAPSULE | Freq: Three times a day (TID) | ORAL | 1 refills | Status: DC

## 2020-07-05 MED ORDER — METHOCARBAMOL 500 MG PO TABS: 500.0000 mg | ORAL_TABLET | Freq: Four times a day (QID) | ORAL | 0 refills | Status: DC

## 2020-07-05 MED ORDER — QUETIAPINE FUMARATE 25 MG PO TABS: 25.0000 mg | ORAL_TABLET | Freq: Every day | ORAL | 1 refills | Status: DC

## 2020-07-05 NOTE — Assessment & Plan Note (Addendum)
Chronic.  Ongoing.  Improved but still not well controlled.  Patient does not have SI and is sleeping better.  Still working to get her into Psychiatry.  Continue with Seroquel. Follow up in 3 months.  Call sooner if concerns arise.

## 2020-07-05 NOTE — Assessment & Plan Note (Signed)
Chronic.  Continue to follow up with Neurosurgery.  Will try to facilitate pain management referral. Continue with Gabapentin. Follow up in 3 months.

## 2020-07-05 NOTE — Progress Notes (Signed)
BP 118/78   Pulse 76   Temp 99 F (37.2 C)   Ht 5' 6.38" (1.686 m)   Wt 122 lb 6 oz (55.5 kg)   SpO2 99%   BMI 19.53 kg/m    Subjective:    Patient ID: Kathy Henry, female    DOB: 18-Jan-1991, 30 y.o.   MRN: [id number]  HPI: Kathy Henry is a 30 y.o. female  Chief Complaint  Patient presents with   Anxiety   Depression    DEPRESSION Patient here today to follow up on her depression and anxiety.  She has not been able see psychiatry. However, she was just approved for medicaid and hoping it will help her get into a psychiatrist. She is able to sleep and not having nightmares with the Seroquel.  Thinks this is an appropriate dose for her.  Denies SI.  Flowsheet Row Office Visit from 07/05/2020 in [facility]  PHQ-9 Total Score 18      GAD 7 : Generalized Anxiety Score 07/05/2020 06/04/2020 05/03/2020  Nervous, Anxious, on Edge 2 3 3   Control/stop worrying 3 3 3   Worry too much - different things 3 3 3   Trouble relaxing 3 3 3   Restless 2 3 3   Easily annoyed or irritable 3 3 3   Afraid - awful might happen 3 3 3   Total GAD 7 Score 19 21 21   Anxiety Difficulty Extremely difficult Extremely difficult Extremely difficult    CHRONIC PAIN Patient saw Neuro surgery who ordered an updated MRI.  She will have that done tomorrow. He also referred her to [facility] for which she has an appointment with in August.  Patient continues to have pain.  She is trying to get into pain management also.  States Gabapentin helps some but isn't great.     Relevant past medical, surgical, family and social history reviewed and updated as indicated. Interim medical history since our last visit reviewed. Allergies and medications reviewed and updated.  Review of Systems  Musculoskeletal:  Positive for back pain.       Leg pain  Psychiatric/Behavioral:  Positive for dysphoric mood. Negative for sleep disturbance and suicidal ideas. The patient is  nervous/anxious.    Per HPI unless specifically indicated above     Objective:    BP 118/78   Pulse 76   Temp 99 F (37.2 C)   Ht 5' 6.38" (1.686 m)   Wt 122 lb 6 oz (55.5 kg)   SpO2 99%   BMI 19.53 kg/m   Wt Readings from Last 3 Encounters:  07/05/20 122 lb 6 oz (55.5 kg)  06/04/20 117 lb 6.4 oz (53.3 kg)  05/03/20 116 lb 3.2 oz (52.7 kg)    Physical Exam Vitals and nursing note reviewed.  Constitutional:      General: She is not in acute distress.    Appearance: Normal appearance. She is normal weight. She is not ill-appearing, toxic-appearing or diaphoretic.  HENT:     Head: Normocephalic.     Right Ear: External ear normal.     Left Ear: External ear normal.     Nose: Nose normal.     Mouth/Throat:     Mouth: Mucous membranes are moist.     Pharynx: Oropharynx is clear.  Eyes:     General:        Right eye: No discharge.        Left eye: No discharge.     Extraocular Movements:  Extraocular movements intact.     Conjunctiva/sclera: Conjunctivae normal.     Pupils: Pupils are equal, round, and reactive to light.  Cardiovascular:     Rate and Rhythm: Normal rate and regular rhythm.     Heart sounds: No murmur heard. Pulmonary:     Effort: Pulmonary effort is normal. No respiratory distress.     Breath sounds: Normal breath sounds. No wheezing or rales.  Musculoskeletal:     Cervical back: Normal range of motion and neck supple.  Skin:    General: Skin is warm and dry.     Capillary Refill: Capillary refill takes less than 2 seconds.  Neurological:     General: No focal deficit present.     Mental Status: She is alert and oriented to person, place, and time. Mental status is at baseline.  Psychiatric:        Mood and Affect: Mood normal.        Behavior: Behavior normal.        Thought Content: Thought content normal.        Judgment: Judgment normal.    Results for orders placed or performed during the hospital encounter of 12/04/17  CBC with  Differential/Platelet  Result Value Ref Range   WBC 12.2 (H) 4.0 - 10.5 K/uL   RBC 4.09 3.87 - 5.11 MIL/uL   Hemoglobin 12.7 12.0 - 15.0 g/dL   HCT 72.5 36.6 - 44.0 %   MCV 93.2 80.0 - 100.0 fL   MCH 31.1 26.0 - 34.0 pg   MCHC 33.3 30.0 - 36.0 g/dL   RDW 34.7 42.5 - 95.6 %   Platelets 241 150 - 400 K/uL   nRBC 0.0 0.0 - 0.2 %   Neutrophils Relative % 81 %   Neutro Abs 9.8 (H) 1.7 - 7.7 K/uL   Lymphocytes Relative 11 %   Lymphs Abs 1.3 0.7 - 4.0 K/uL   Monocytes Relative 7 %   Monocytes Absolute 0.9 0.1 - 1.0 K/uL   Eosinophils Relative 0 %   Eosinophils Absolute 0.0 0.0 - 0.5 K/uL   Basophils Relative 0 %   Basophils Absolute 0.0 0.0 - 0.1 K/uL   Immature Granulocytes 1 %   Abs Immature Granulocytes 0.07 0.00 - 0.07 K/uL  Comprehensive metabolic panel  Result Value Ref Range   Sodium 135 135 - 145 mmol/L   Potassium 4.1 3.5 - 5.1 mmol/L   Chloride 101 98 - 111 mmol/L   CO2 25 22 - 32 mmol/L   Glucose, Bld 115 (H) 70 - 99 mg/dL   BUN 15 6 - 20 mg/dL   Creatinine, Ser 3.87 0.44 - 1.00 mg/dL   Calcium 9.2 8.9 - 56.4 mg/dL   Total Protein 7.4 6.5 - 8.1 g/dL   Albumin 4.3 3.5 - 5.0 g/dL   AST 30 15 - 41 U/L   ALT 18 0 - 44 U/L   Alkaline Phosphatase 43 38 - 126 U/L   Total Bilirubin 0.8 0.3 - 1.2 mg/dL   GFR calc non Af Amer >60 >60 mL/min   GFR calc Af Amer >60 >60 mL/min   Anion gap 9 5 - 15  Urine Drug Screen, Qualitative ([facility] only)  Result Value Ref Range   Tricyclic, Ur Screen NONE DETECTED NONE DETECTED   Amphetamines, Ur Screen NONE DETECTED NONE DETECTED   MDMA (Ecstasy)Ur Screen NONE DETECTED NONE DETECTED   Cocaine Metabolite,Ur [place] POSITIVE (A) NONE DETECTED   Opiate, Ur Screen NONE DETECTED NONE DETECTED  Phencyclidine (PCP) Ur S NONE DETECTED NONE DETECTED   Cannabinoid 50 Ng, Ur [place] POSITIVE (A) NONE DETECTED   Barbiturates, Ur Screen NONE DETECTED NONE DETECTED   Benzodiazepine, Ur Scrn POSITIVE (A) NONE DETECTED   Methadone Scn, Ur NONE DETECTED NONE  DETECTED  Ethanol  Result Value Ref Range   Alcohol, Ethyl (B) <10 <10 mg/dL  Pregnancy, urine POC  Result Value Ref Range   Preg Test, Ur NEGATIVE NEGATIVE      Assessment & Plan:   Problem List Items Addressed This Visit       Other   MDD (major depressive disorder), recurrent episode, severe (HCC) - Primary    Chronic.  Ongoing.  Improved but still not well controlled.  Patient does not have SI and is sleeping better.  Still working to get her into Psychiatry.  Continue with Seroquel. Follow up in 3 months.  Call sooner if concerns arise.        Thoracic spine pain    Chronic.  Continue to follow up with Neurosurgery.  Will try to facilitate pain management referral. Continue with Gabapentin. Follow up in 3 months.       Relevant Medications   gabapentin (NEURONTIN) 300 MG capsule   methocarbamol (ROBAXIN) 500 MG tablet   Other Visit Diagnoses     Financial difficulties       Will reach out to Social worker to see if she is able to help with disability and rides to and from appointments.         Follow up plan: Return in about 3 months (around 10/05/2020) for Depression/Anxiety FU.   A total of 20 minutes were spent on this encounter today.  When total time is documented, this includes both the face-to-face and non-face-to-face time personally spent before, during and after the visit on the date of the encounter.

## 2020-07-06 ENCOUNTER — Ambulatory Visit
Admission: RE | Admit: 2020-07-06 | Discharge: 2020-07-06 | Disposition: A | Discharge status: Home / Self Care | Payer: 59 | Source: Ambulatory Visit | Attending: Neurosurgery | Admitting: Neurosurgery

## 2020-07-06 ENCOUNTER — Telehealth: Payer: Self-pay | Admitting: Nurse Practitioner

## 2020-07-06 DIAGNOSIS — S24103D Unspecified injury at T7-T10 level of thoracic spinal cord, subsequent encounter: Secondary | ICD-10-CM

## 2020-07-06 DIAGNOSIS — G959 Disease of spinal cord, unspecified: Secondary | ICD-10-CM | POA: Diagnosis not present

## 2020-07-06 DIAGNOSIS — Z7689 Persons encountering health services in other specified circumstances: Secondary | ICD-10-CM | POA: Diagnosis not present

## 2020-07-06 NOTE — Telephone Encounter (Signed)
Can we let patient know that for now the best option is for her to walk into [facility] to see psychiatry.

## 2020-07-06 NOTE — Telephone Encounter (Signed)
Patient was notified of Larae Grooms, NP recommendations. Patient verbalized understanding and has no further questions.

## 2020-07-13 ENCOUNTER — Ambulatory Visit: Payer: 59 | Admitting: Licensed Clinical Social Worker

## 2020-07-13 NOTE — Patient Instructions (Signed)
Visit Information   Goals Addressed             This Visit's Progress    Mangement of Depression and Anxiety Symptoms   On track    Patient Goals/Self-Care Activities: Over the next 120 days  Attend all scheduled appointments Continue compliance with med management Contact office with any questions or concerns Initiate medication management  - avoid negative self-talk - practice relaxation or meditation daily - talk about feelings with a friend, family or spiritual advisor - practice positive thinking and self-talk         Patient verbalizes understanding of instructions provided today and agrees to view in MyChart.   Telephone follow up appointment with care management team member scheduled for: Within one week from 07/13/20  Jenel Lucks, MSW, LCSW [facility] Care Management [place]  [facility] [email or web address] Phone [phone] 12:25 PM
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
## 2020-10-05 ENCOUNTER — Ambulatory Visit: Payer: 59 | Admitting: Nurse Practitioner

## 2020-10-22 ENCOUNTER — Ambulatory Visit: Payer: 59 | Admitting: Nurse Practitioner

## 2020-11-04 DIAGNOSIS — G894 Chronic pain syndrome: Secondary | ICD-10-CM | POA: Insufficient documentation

## 2020-11-04 DIAGNOSIS — Z79899 Other long term (current) drug therapy: Secondary | ICD-10-CM | POA: Insufficient documentation

## 2020-11-04 DIAGNOSIS — M899 Disorder of bone, unspecified: Secondary | ICD-10-CM | POA: Insufficient documentation

## 2020-11-04 DIAGNOSIS — Z789 Other specified health status: Secondary | ICD-10-CM | POA: Insufficient documentation

## 2020-11-04 NOTE — Progress Notes (Signed)
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 suicidal ideations or attempts Gastrointestinal: Heartburn due to stomach pushing into lungs (Hiatal hernia) and Irregular, infrequent bowel movements (Constipation) Genitourinary: No reported renal or genitourinary signs or symptoms such as difficulty voiding or producing urine, peeing blood, non-functioning kidney, kidney stones, difficulty emptying the bladder, difficulty controlling the flow of urine, or chronic kidney disease Hematological: No reported hematological signs or symptoms such as prolonged bleeding, low or poor functioning platelets, bruising or bleeding easily, hereditary bleeding problems, low energy levels due to low hemoglobin or being anemic Endocrine: No reported  endocrine signs or symptoms such as high or low blood sugar, rapid heart rate due to high thyroid levels, obesity or weight gain due to slow thyroid or thyroid disease Rheumatologic: No reported rheumatological signs and symptoms such as fatigue, joint pain, tenderness, swelling, redness, heat, stiffness, decreased range of motion, with or without associated rash Musculoskeletal: Negative for myasthenia gravis, muscular dystrophy, multiple sclerosis or malignant hyperthermia Work History: Unemployed  Allergies  Ms. Kaneshiro has No Known Allergies.  Laboratory Chemistry Profile   Renal Lab Results  Component Value Date   BUN 15 12/04/2017   CREATININE 0.52 12/04/2017   GFRAA >60 12/04/2017   GFRNONAA >60 12/04/2017   PROTEINUR NEGATIVE 07/15/2016     Electrolytes Lab Results  Component Value Date   NA 135 12/04/2017   K 4.1 12/04/2017   CL 101 12/04/2017   CALCIUM 9.2 12/04/2017     Hepatic Lab Results  Component Value Date   AST 30 12/04/2017   ALT 18 12/04/2017   ALBUMIN 4.3 12/04/2017   ALKPHOS 43 12/04/2017   LIPASE 22 07/15/2016     ID Lab Results  Component Value Date   PREGTESTUR NEGATIVE 12/04/2017     Bone  No results found for: VD25OH, [id number], [id number], WU1324MW1, 25OHVITD1, 25OHVITD2, 25OHVITD3, TESTOFREE, TESTOSTERONE   Endocrine Lab Results  Component Value Date   GLUCOSE 115 (H) 12/04/2017   GLUCOSEU NEGATIVE 07/15/2016   HGBA1C 4.7 08/17/2015   TSH 1.824 08/17/2015     Neuropathy Lab Results  Component Value Date   HGBA1C 4.7 08/17/2015     CNS No results found for: COLORCSF, APPEARCSF, RBCCOUNTCSF, WBCCSF, POLYSCSF, LYMPHSCSF, EOSCSF, PROTEINCSF, GLUCCSF, JCVIRUS, CSFOLI, IGGCSF, LABACHR, ACETBL, LABACHR, ACETBL   Inflammation (CRP: Acute  ESR: Chronic) No results found for: CRP, ESRSEDRATE, LATICACIDVEN   Rheumatology No results found for: RF, ANA, LABURIC, URICUR, LYMEIGGIGMAB, LYMEABIGMQN, HLAB27   Coagulation Lab Results   Component Value Date   PLT 241 12/04/2017     Cardiovascular Lab Results  Component Value Date   TROPONINI <0.03 02/27/2015   HGB 12.7 12/04/2017   HCT 38.1 12/04/2017     Screening Lab Results  Component Value Date   PREGTESTUR NEGATIVE 12/04/2017     Cancer No results found for: CEA, CA125, LABCA2   Allergens No results found for: ALMOND, APPLE, ASPARAGUS, AVOCADO, BANANA, BARLEY, BASIL, BAYLEAF, GREENBEAN, LIMABEAN, WHITEBEAN, BEEFIGE, REDBEET, BLUEBERRY, BROCCOLI, CABBAGE, MELON, CARROT, CASEIN, CASHEWNUT, CAULIFLOWER, CELERY     Note: Lab results reviewed.  [place]  Drug: Ms. Ramthun  reports no history of drug use. Alcohol:  reports no history of alcohol use. Tobacco:  reports that she quit smoking about 5 years ago. Her smoking use included cigarettes. She has a 2.50 pack-year smoking history. She has never used smokeless tobacco. Medical:  has a past medical history of Anxiety, Bipolar 1 disorder ([place]), Depression, and OCD (obsessive compulsive disorder). Family: family history includes COPD in her father; Heart failure in her father; Ovarian cancer in her mother.  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   Opioid use disorder, moderate, dependence ([place]) 08/17/2015   Borderline personality disorder ([place]) 08/20/2015   Fall from balcony 12/04/2017   Thoracic spine pain 05/03/2020   Insomnia 06/04/2020   Chronic pain syndrome 11/04/2020   Pharmacologic therapy 11/04/2020   Disorder of skeletal system 11/04/2020   Problems influencing health status  11/04/2020   History of cannabis dependence/abuse ([place]) 11/05/2020   History of cocaine use 11/05/2020   History of nicotine dependence 11/05/2020   History of substance use disorder 11/05/2020   Abnormal drug screen 11/05/2020   Chronic low back pain (1ry area of Pain) (Bilateral) (L>R) w/ sciatica (Bilateral) 11/05/2020   Chronic lower extremity pain (2ry area of Pain) (Bilateral) (L>R) 11/05/2020   Foot drop (Left) 11/05/2020   Chronic neck pain (3ry area of Pain) (Bilateral) (L>R) 11/05/2020   Chronic hip pain (4th area of Pain) (Bilateral) (L>R) 11/05/2020   Chronic hand pain (Bilateral) 11/05/2020   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 In no apparent acute distress Vitals:   11/05/20 1311  BP: (!) 131/95  Pulse: 97  Resp: 16  Temp: (!) 96.9 F (36.1 C)  TempSrc: Temporal  SpO2: 100%  Weight: 128 lb (58.1 kg)  Height: '5\' 8"'  (1.727 m)   BMI Assessment: Estimated body mass index is 19.46 kg/m as calculated from the following:   Height as of this encounter: '5\' 8"'  (1.727 m).   Weight as of this encounter: 128 lb (58.1 kg).  BMI interpretation table: BMI level Category Range association with higher incidence of chronic pain  <18 kg/m2 Underweight   18.5-24.9 kg/m2 Ideal body weight   25-29.9 kg/m2 Overweight Increased incidence by 20%  30-34.9 kg/m2 Obese (Class I) Increased incidence by 68%  35-39.9 kg/m2 Severe obesity (Class II)  Increased incidence by 136%  >40 kg/m2 Extreme obesity (Class III) Increased incidence by 254%   Patient's current BMI Ideal Body weight  Body mass index is 19.46 kg/m. Ideal body weight: 63.9 kg (140 lb 14 oz)   BMI Readings from Last 4 Encounters:  11/05/20 19.46 kg/m  07/05/20 19.53 kg/m  06/04/20 18.38 kg/m  05/03/20 18.20 kg/m   Wt Readings from Last 4 Encounters:  11/05/20 128 lb (58.1 kg)  07/05/20 122 lb 6 oz (55.5 kg)  06/04/20 117 lb 6.4 oz (53.3 kg)  05/03/20 116 lb 3.2 oz (52.7 kg)    Psych/Mental status: Alert, oriented x 3 (person, place, & time)       Eyes: PERLA Respiratory: No evidence of acute respiratory distress  Assessment  Primary Diagnosis & Pertinent Problem List: The primary encounter diagnosis was Chronic low back pain (1ry area of Pain) (Bilateral) (L>R) w/ sciatica (Bilateral). Diagnoses of Chronic lower extremity pain (2ry area of Pain) (Bilateral) (L>R), Left foot drop, Chronic neck pain (3ry area of Pain) (Bilateral) (L>R), Chronic hip pain (4th area of Pain) (Bilateral) (L>R), Chronic hand pain (Bilateral), Failed back surgical syndrome, Chronic thoracic back pain (Bilateral), Chronic sacroiliac joint pain (Bilateral), Lumbar facet joint syndrome (Bilateral), Chronic lumbar radiculopathy (Left), Chronic pain syndrome, Pharmacologic therapy, Disorder of skeletal system, Problems influencing health status, Abnormal drug screen, History of cannabis dependence/abuse ([place]), History of cocaine use, History of nicotine dependence, History of substance use disorder, Borderline personality disorder ([place]), and Persistent depressive disorder were also pertinent to this visit.  Visit Diagnosis (New problems to examiner): 1. Chronic low back pain (1ry area of Pain) (Bilateral) (L>R) w/ sciatica (Bilateral)   2. Chronic lower extremity pain (2ry area of Pain) (Bilateral) (L>R)   3. Left foot drop   4. Chronic neck pain (3ry area of Pain) (Bilateral) (L>R)   5.  Chronic hip pain (4th area of Pain) (Bilateral) (L>R)   6. Chronic hand pain (Bilateral)   7. Failed back surgical syndrome   8. Chronic thoracic back pain (Bilateral)   9. Chronic sacroiliac joint pain (Bilateral)   10. Lumbar facet joint syndrome (Bilateral)   11. Chronic lumbar radiculopathy (Left)   12. Chronic pain syndrome   13. Pharmacologic therapy   14. Disorder of skeletal system   15. Problems influencing health status   16. Abnormal drug screen   17. History of cannabis dependence/abuse ([place])   18. History of cocaine use  19. History of nicotine dependence   20. History of substance use disorder   21. Borderline personality disorder ([place])   22. Persistent depressive disorder    Plan of Care (Initial workup plan)  Note: Ms. Rane was reminded that as per protocol, today's visit has been an evaluation only. We have not taken over the patient's controlled substance management.  Problem-specific plan: No problem-specific Assessment & Plan notes found for this encounter. Lab Orders         Compliance Drug Analysis, Ur         Comp. Metabolic Panel (12)         Magnesium         Vitamin B12         Sedimentation rate         25-Hydroxy vitamin D Lcms D2+D3         C-reactive protein     Imaging Orders         DG HIP UNILAT W OR W/O PELVIS 2-3 VIEWS RIGHT         DG HIP UNILAT W OR W/O PELVIS 2-3 VIEWS LEFT         DG Si Joints         DG Lumbar Spine Complete W/Bend     Referral Orders         Ambulatory referral to Physical Therapy         Ambulatory referral to Psychiatry     Procedure Orders    No procedure(s) ordered today   Pharmacotherapy (current): Medications ordered:  Meds ordered this encounter  Medications   predniSONE (DELTASONE) 20 MG tablet    Sig: Take 3 tablets (60 mg total) by mouth daily with breakfast for 5 days, THEN 2 tablets (40 mg total) daily with breakfast for 5 days, THEN 1 tablet (20 mg total) daily with breakfast for 5 days.     Dispense:  30 tablet    Refill:  0    Medications administered during this visit: Rema Fendt. [place] had no medications administered during this visit.   Pharmacological management options:  Opioid Analgesics: The patient was informed that there is no guarantee that she would be a candidate for opioid analgesics. The decision will be made following CDC guidelines. This decision will be based on the results of diagnostic studies, as well as Ms. Grafton's risk profile.   Membrane stabilizer: To be determined at a later time  Muscle relaxant: To be determined at a later time  NSAID: To be determined at a later time  Other analgesic(s): To be determined at a later time   Interventional management options: Ms. Leon was informed that there is no guarantee that she would be a candidate for interventional therapies. The decision will be based on the results of diagnostic studies, as well as Ms. Cradle's risk profile.  Procedure(s) under consideration:  Pending results of ordered studies    Interventional Therapies  Risk  Complexity Considerations:   Estimated body mass index is 19.46 kg/m as calculated from the following:   Height as of this encounter: '5\' 8"'  (1.727 m).   Weight as of this encounter: 128 lb (58.1 kg). WNL   Planned  Pending:   Pending further evaluation   Under consideration:   Diagnostic bilateral lumbar facet block    Completed:   None at this time   Therapeutic  Palliative (PRN) options:   None established    Provider-requested follow-up: Return for (40mn), Eval-day (M,W), (F2F),  2nd Visit, for review of ordered tests.  Future Appointments  Date Time Provider [place]  11/21/2020  3:00 PM Jon Billings, NP [facility]  01/23/2021  1:00 PM Milinda Pointer, MD [facility] None    Note by: Gaspar Cola, MD Date: 11/05/2020; Time: 5:11 PM

## 2020-11-05 ENCOUNTER — Ambulatory Visit: Payer: 59 | Attending: Pain Medicine | Admitting: Pain Medicine

## 2020-11-05 ENCOUNTER — Other Ambulatory Visit: Payer: Self-pay

## 2020-11-05 ENCOUNTER — Encounter: Payer: Self-pay | Admitting: Pain Medicine

## 2020-11-05 VITALS — BP 131/95 | HR 97 | Temp 96.9°F | Resp 16 | Ht 68.0 in | Wt 128.0 lb

## 2020-11-05 DIAGNOSIS — M546 Pain in thoracic spine: Secondary | ICD-10-CM | POA: Diagnosis not present

## 2020-11-05 DIAGNOSIS — R892 Abnormal level of other drugs, medicaments and biological substances in specimens from other organs, systems and tissues: Secondary | ICD-10-CM

## 2020-11-05 DIAGNOSIS — M47816 Spondylosis without myelopathy or radiculopathy, lumbar region: Secondary | ICD-10-CM

## 2020-11-05 DIAGNOSIS — M79605 Pain in left leg: Secondary | ICD-10-CM | POA: Diagnosis not present

## 2020-11-05 DIAGNOSIS — M533 Sacrococcygeal disorders, not elsewhere classified: Secondary | ICD-10-CM | POA: Diagnosis not present

## 2020-11-05 DIAGNOSIS — M5442 Lumbago with sciatica, left side: Secondary | ICD-10-CM | POA: Diagnosis not present

## 2020-11-05 DIAGNOSIS — M899 Disorder of bone, unspecified: Secondary | ICD-10-CM

## 2020-11-05 DIAGNOSIS — F1221 Cannabis dependence, in remission: Secondary | ICD-10-CM

## 2020-11-05 DIAGNOSIS — G894 Chronic pain syndrome: Secondary | ICD-10-CM | POA: Diagnosis not present

## 2020-11-05 DIAGNOSIS — G8929 Other chronic pain: Secondary | ICD-10-CM

## 2020-11-05 DIAGNOSIS — Z7689 Persons encountering health services in other specified circumstances: Secondary | ICD-10-CM | POA: Diagnosis not present

## 2020-11-05 DIAGNOSIS — M961 Postlaminectomy syndrome, not elsewhere classified: Secondary | ICD-10-CM

## 2020-11-05 DIAGNOSIS — M21372 Foot drop, left foot: Secondary | ICD-10-CM | POA: Diagnosis not present

## 2020-11-05 DIAGNOSIS — M79641 Pain in right hand: Secondary | ICD-10-CM | POA: Diagnosis not present

## 2020-11-05 DIAGNOSIS — M5441 Lumbago with sciatica, right side: Secondary | ICD-10-CM | POA: Insufficient documentation

## 2020-11-05 DIAGNOSIS — M542 Cervicalgia: Secondary | ICD-10-CM | POA: Diagnosis not present

## 2020-11-05 DIAGNOSIS — Z789 Other specified health status: Secondary | ICD-10-CM

## 2020-11-05 DIAGNOSIS — M25551 Pain in right hip: Secondary | ICD-10-CM | POA: Insufficient documentation

## 2020-11-05 DIAGNOSIS — F341 Dysthymic disorder: Secondary | ICD-10-CM | POA: Diagnosis present

## 2020-11-05 DIAGNOSIS — F603 Borderline personality disorder: Secondary | ICD-10-CM | POA: Diagnosis not present

## 2020-11-05 DIAGNOSIS — M5416 Radiculopathy, lumbar region: Secondary | ICD-10-CM

## 2020-11-05 DIAGNOSIS — Z87898 Personal history of other specified conditions: Secondary | ICD-10-CM | POA: Insufficient documentation

## 2020-11-05 DIAGNOSIS — F1491 Cocaine use, unspecified, in remission: Secondary | ICD-10-CM | POA: Diagnosis not present

## 2020-11-05 DIAGNOSIS — M25552 Pain in left hip: Secondary | ICD-10-CM | POA: Insufficient documentation

## 2020-11-05 DIAGNOSIS — Z79899 Other long term (current) drug therapy: Secondary | ICD-10-CM

## 2020-11-05 DIAGNOSIS — M79604 Pain in right leg: Secondary | ICD-10-CM | POA: Diagnosis not present

## 2020-11-05 DIAGNOSIS — M79642 Pain in left hand: Secondary | ICD-10-CM | POA: Insufficient documentation

## 2020-11-05 DIAGNOSIS — F32A Depression, unspecified: Secondary | ICD-10-CM | POA: Insufficient documentation

## 2020-11-05 DIAGNOSIS — Z87891 Personal history of nicotine dependence: Secondary | ICD-10-CM

## 2020-11-05 MED ORDER — PREDNISONE 20 MG PO TABS: ORAL_TABLET | ORAL | 0 refills | Status: DC

## 2020-11-05 NOTE — Progress Notes (Signed)
Safety precautions to be maintained throughout the outpatient stay will include: orient to surroundings, keep bed in low position, maintain call bell within reach at all times, provide assistance with transfer out of bed and ambulation.  

## 2020-11-06 ENCOUNTER — Ambulatory Visit: Payer: 59 | Admitting: Nurse Practitioner

## 2020-11-10 LAB — COMPLIANCE DRUG ANALYSIS, UR

## 2020-11-11 LAB — COMP. METABOLIC PANEL (12)
AST: 21 IU/L (ref 0–40)
Albumin/Globulin Ratio: 1.8 (ref 1.2–2.2)
Albumin: 4.8 g/dL (ref 3.8–4.8)
Alkaline Phosphatase: 56 IU/L (ref 44–121)
BUN/Creatinine Ratio: 13 (ref 9–23)
BUN: 10 mg/dL (ref 6–20)
Bilirubin Total: 0.4 mg/dL (ref 0.0–1.2)
Calcium: 10 mg/dL (ref 8.7–10.2)
Chloride: 103 mmol/L (ref 96–106)
Creatinine, Ser: 0.78 mg/dL (ref 0.57–1.00)
Globulin, Total: 2.6 g/dL (ref 1.5–4.5)
Glucose: 130 mg/dL — ABNORMAL HIGH (ref 70–99)
Potassium: 4.5 mmol/L (ref 3.5–5.2)
Sodium: 139 mmol/L (ref 134–144)
Total Protein: 7.4 g/dL (ref 6.0–8.5)
eGFR: 103 mL/min/{1.73_m2} (ref >59)

## 2020-11-11 LAB — VITAMIN B12: Vitamin B-12: 317 pg/mL (ref 232–1245)

## 2020-11-11 LAB — 25-HYDROXY VITAMIN D LCMS D2+D3
25-Hydroxy, Vitamin D-2: <1 ng/mL
25-Hydroxy, Vitamin D-3: 16 ng/mL
25-Hydroxy, Vitamin D: 16 ng/mL — ABNORMAL LOW

## 2020-11-11 LAB — C-REACTIVE PROTEIN: CRP: <1 mg/L (ref 0–10)

## 2020-11-11 LAB — MAGNESIUM: Magnesium: 2 mg/dL (ref 1.6–2.3)

## 2020-11-11 LAB — SEDIMENTATION RATE: Sed Rate: 4 mm/hr (ref 0–32)

## 2020-11-12 ENCOUNTER — Telehealth: Payer: Self-pay

## 2020-11-12 ENCOUNTER — Other Ambulatory Visit: Payer: Self-pay | Admitting: Student in an Organized Health Care Education/Training Program

## 2020-11-12 DIAGNOSIS — M546 Pain in thoracic spine: Secondary | ICD-10-CM

## 2020-11-12 DIAGNOSIS — M79605 Pain in left leg: Secondary | ICD-10-CM

## 2020-11-12 DIAGNOSIS — M533 Sacrococcygeal disorders, not elsewhere classified: Secondary | ICD-10-CM

## 2020-11-12 DIAGNOSIS — M79641 Pain in right hand: Secondary | ICD-10-CM

## 2020-11-12 DIAGNOSIS — M5441 Lumbago with sciatica, right side: Secondary | ICD-10-CM

## 2020-11-12 DIAGNOSIS — M47816 Spondylosis without myelopathy or radiculopathy, lumbar region: Secondary | ICD-10-CM

## 2020-11-12 DIAGNOSIS — G8929 Other chronic pain: Secondary | ICD-10-CM

## 2020-11-12 DIAGNOSIS — M79642 Pain in left hand: Secondary | ICD-10-CM

## 2020-11-12 MED ORDER — PREDNISONE 20 MG PO TABS: ORAL_TABLET | ORAL | 0 refills | Status: AC

## 2020-11-12 NOTE — Telephone Encounter (Signed)
Pt is homeless and have to stay house to house she's currently in [place] and have no way to get her medicine from [facility] pt asks if we can send the prescription to [facility] please reach out to pt

## 2020-11-12 NOTE — Telephone Encounter (Signed)
I have a bubble ou to Dr. Cherylann Ratel and I have called patient.

## 2020-11-12 NOTE — Telephone Encounter (Signed)
I called the [facility] pharmacy and they took the information and stated they would take care of this and call the patient with pick up information.

## 2020-11-21 ENCOUNTER — Encounter: Payer: Self-pay | Admitting: Nurse Practitioner

## 2020-11-21 ENCOUNTER — Ambulatory Visit (INDEPENDENT_AMBULATORY_CARE_PROVIDER_SITE_OTHER): Payer: Medicaid Other | Admitting: Nurse Practitioner

## 2020-11-21 ENCOUNTER — Other Ambulatory Visit: Payer: Self-pay

## 2020-11-21 VITALS — BP 131/85 | HR 83 | Temp 98.1°F | Wt 130.0 lb

## 2020-11-21 DIAGNOSIS — K5900 Constipation, unspecified: Secondary | ICD-10-CM

## 2020-11-21 DIAGNOSIS — R7301 Impaired fasting glucose: Secondary | ICD-10-CM | POA: Diagnosis not present

## 2020-11-21 DIAGNOSIS — F341 Dysthymic disorder: Secondary | ICD-10-CM

## 2020-11-21 DIAGNOSIS — M546 Pain in thoracic spine: Secondary | ICD-10-CM

## 2020-11-21 DIAGNOSIS — M5441 Lumbago with sciatica, right side: Secondary | ICD-10-CM

## 2020-11-21 DIAGNOSIS — M5442 Lumbago with sciatica, left side: Secondary | ICD-10-CM | POA: Diagnosis not present

## 2020-11-21 DIAGNOSIS — G8929 Other chronic pain: Secondary | ICD-10-CM

## 2020-11-21 DIAGNOSIS — F603 Borderline personality disorder: Secondary | ICD-10-CM | POA: Diagnosis not present

## 2020-11-21 DIAGNOSIS — Z23 Encounter for immunization: Secondary | ICD-10-CM

## 2020-11-21 MED ORDER — MAGNESIUM CITRATE PO SOLN: 1.0000 | Freq: Once | ORAL | 0 refills | Status: AC

## 2020-11-21 MED ORDER — QUETIAPINE FUMARATE 25 MG PO TABS: 25.0000 mg | ORAL_TABLET | Freq: Every day | ORAL | 1 refills | Status: DC

## 2020-11-21 MED ORDER — METHOCARBAMOL 500 MG PO TABS: 500.0000 mg | ORAL_TABLET | Freq: Four times a day (QID) | ORAL | 1 refills | Status: DC

## 2020-11-21 MED ORDER — GABAPENTIN 400 MG PO CAPS: 400.0000 mg | ORAL_CAPSULE | Freq: Three times a day (TID) | ORAL | 1 refills | Status: DC

## 2020-11-21 NOTE — Assessment & Plan Note (Signed)
Improved with Seroquel. Depression and Anxiety are not well controlled but patient is sleeping better. Patient has not seen psychiatry.  She currently is homeless and doesn't have a car.  It is difficult for her to get to appointments but she understands that she needs to see one. Denies SI. Follow up in 3 months.  Call sooner if concerns arise.

## 2020-11-21 NOTE — Progress Notes (Signed)
BP 131/85   Pulse 83   Temp 98.1 F (36.7 C)   Wt 130 lb (59 kg)   SpO2 98%   BMI 19.77 kg/m    Subjective:    Patient ID: Kathy Henry, female    DOB: 10-20-1990, 30 y.o.   MRN: [id number]  HPI: Kathy Henry is a 30 y.o. female  Chief Complaint  Patient presents with   Depression   Anxiety   Constipation    Patient states she has not had a BM in 2 weeks    Hip Pain    Patient states she has been having hip pain for a while but right side is worse    DEPRESSION/ANXIETY Patient states she has not seen a psychiatrist yet.  The Seroquel is still helping her sleep at night.  She is aware that her symptoms are not well controlled.  Denies SI.   [place] Office Visit from 11/21/2020 in [facility]  PHQ-9 Total Score 27      GAD 7 : Generalized Anxiety Score 11/21/2020 07/05/2020 06/04/2020 05/03/2020  Nervous, Anxious, on Edge '3 2 3 3  ' Control/stop worrying '3 3 3 3  ' Worry too much - different things '3 3 3 3  ' Trouble relaxing '3 3 3 3  ' Restless '3 2 3 3  ' Easily annoyed or irritable '3 3 3 3  ' Afraid - awful might happen '3 3 3 3  ' Total GAD 7 Score '21 19 21 21  ' Anxiety Difficulty Extremely difficult Extremely difficult Extremely difficult Extremely difficult    CONSTIPATION Patient states she hasn't had a bowel movement in 2 weeks. She has tried Miralax, stool softeners.  States she is drinking about 3 cups of water per day. She does eat vegetables but not a lot of fruit. She took the miralax BID for 3 days and did not have any bowel movement.  BACK PAIN Patient saw pain management. She is going to get xrays and further imaging done.  She has follow up scheduled with them.   Relevant past medical, surgical, family and social history reviewed and updated as indicated. Interim medical history since our last visit reviewed. Allergies and medications reviewed and updated.  Review of Systems  Gastrointestinal:  Positive for constipation.  Musculoskeletal:   Positive for back pain.  Psychiatric/Behavioral:  Positive for dysphoric mood and sleep disturbance. Negative for suicidal ideas. The patient is nervous/anxious.    Per HPI unless specifically indicated above     Objective:    BP 131/85   Pulse 83   Temp 98.1 F (36.7 C)   Wt 130 lb (59 kg)   SpO2 98%   BMI 19.77 kg/m   Wt Readings from Last 3 Encounters:  11/21/20 130 lb (59 kg)  11/05/20 128 lb (58.1 kg)  07/05/20 122 lb 6 oz (55.5 kg)    Physical Exam Vitals and nursing note reviewed.  Constitutional:      General: She is not in acute distress.    Appearance: Normal appearance. She is normal weight. She is not ill-appearing, toxic-appearing or diaphoretic.  HENT:     Head: Normocephalic.     Right Ear: External ear normal.     Left Ear: External ear normal.     Nose: Nose normal.     Mouth/Throat:     Mouth: Mucous membranes are moist.     Pharynx: Oropharynx is clear.  Eyes:     General:        Right eye:  No discharge.        Left eye: No discharge.     Extraocular Movements: Extraocular movements intact.     Conjunctiva/sclera: Conjunctivae normal.     Pupils: Pupils are equal, round, and reactive to light.  Cardiovascular:     Rate and Rhythm: Normal rate and regular rhythm.     Heart sounds: No murmur heard. Pulmonary:     Effort: Pulmonary effort is normal. No respiratory distress.     Breath sounds: Normal breath sounds. No wheezing or rales.  Abdominal:     General: Abdomen is flat. There is distension.     Tenderness: There is abdominal tenderness. There is no right CVA tenderness, left CVA tenderness or guarding.  Musculoskeletal:     Cervical back: Normal range of motion and neck supple.  Skin:    General: Skin is warm and dry.     Capillary Refill: Capillary refill takes less than 2 seconds.  Neurological:     General: No focal deficit present.     Mental Status: She is alert and oriented to person, place, and time. Mental status is at baseline.   Psychiatric:        Mood and Affect: Mood normal.        Behavior: Behavior normal.        Thought Content: Thought content normal.        Judgment: Judgment normal.    Results for orders placed or performed in visit on 11/05/20  Compliance Drug Analysis, Ur  Result Value Ref Range   Summary Note   Comp. Metabolic Panel (12)  Result Value Ref Range   Glucose 130 (H) 70 - 99 mg/dL   BUN 10 6 - 20 mg/dL   Creatinine, Ser 0.78 0.57 - 1.00 mg/dL   eGFR 103 >59 mL/min/1.73   BUN/Creatinine Ratio 13 9 - 23   Sodium 139 134 - 144 mmol/L   Potassium 4.5 3.5 - 5.2 mmol/L   Chloride 103 96 - 106 mmol/L   Calcium 10.0 8.7 - 10.2 mg/dL   Total Protein 7.4 6.0 - 8.5 g/dL   Albumin 4.8 3.8 - 4.8 g/dL   Globulin, Total 2.6 1.5 - 4.5 g/dL   Albumin/Globulin Ratio 1.8 1.2 - 2.2   Bilirubin Total 0.4 0.0 - 1.2 mg/dL   Alkaline Phosphatase 56 44 - 121 IU/L   AST 21 0 - 40 IU/L  Magnesium  Result Value Ref Range   Magnesium 2.0 1.6 - 2.3 mg/dL  Vitamin B12  Result Value Ref Range   Vitamin B-12 317 232 - 1,245 pg/mL  Sedimentation rate  Result Value Ref Range   Sed Rate 4 0 - 32 mm/hr  25-Hydroxy vitamin D Lcms D2+D3  Result Value Ref Range   25-Hydroxy, Vitamin D 16 (L) ng/mL   25-Hydroxy, Vitamin D-2 <1.0 ng/mL   25-Hydroxy, Vitamin D-3 16 ng/mL  C-reactive protein  Result Value Ref Range   CRP <1 0 - 10 mg/L      Assessment & Plan:   Problem List Items Addressed This Visit       Nervous and Auditory   Chronic low back pain (1ry area of Pain) (Bilateral) (L>R) w/ sciatica (Bilateral) - Primary (Chronic)    Chronic. Improved with Gabapentin and Robaxin.  Patient is also seeing pain management.  Has follow up scheduled and further imaging to be done.      Relevant Medications   gabapentin (NEURONTIN) 400 MG capsule   QUEtiapine (SEROQUEL) 25 MG  tablet   methocarbamol (ROBAXIN) 500 MG tablet     Other   Depression (Chronic)    Improved with Seroquel. Depression and  Anxiety are not well controlled but patient is sleeping better. Patient has not seen psychiatry.  She currently is homeless and doesn't have a car.  It is difficult for her to get to appointments but she understands that she needs to see one. Denies SI. Follow up in 3 months.  Call sooner if concerns arise.       Borderline personality disorder ([place])    Improved with Seroquel. Depression and Anxiety are not well controlled but patient is sleeping better. Patient has not seen psychiatry.  She currently is homeless and doesn't have a car.  It is difficult for her to get to appointments but she understands that she needs to see one. Denies SI. Follow up in 3 months.  Call sooner if concerns arise.       Thoracic spine pain    Improved with Gabapentin and Robaxin. Refills sent today. Patient is seeing pain management.       Relevant Medications   gabapentin (NEURONTIN) 400 MG capsule   methocarbamol (ROBAXIN) 500 MG tablet   Other Visit Diagnoses     Elevated fasting glucose       Several elevated glucose levels.  Will draw A1c and evaluate for diabetes.   Relevant Orders   HgB A1c   Need for influenza vaccination       Relevant Orders   Flu Vaccine QUAD 6+ mos PF IM (Fluarix Quad PF) (Completed)   Constipation, unspecified constipation type       Magnesium citrate sent to the pharmacy to help clean patient out. Can use Miralax BID if symptoms return. FU if symptoms worsen or fail to improve.        Follow up plan: Return in about 3 months (around 02/21/2021) for Depression/Anxiety FU.    A total of 30 minutes were spent on this encounter today.  When total time is documented, this includes both the face-to-face and non-face-to-face time personally spent before, during and after the visit on the date of the encounter reviewing specialist appointments and plan of care for constipation.

## 2020-11-21 NOTE — Assessment & Plan Note (Signed)
Chronic. Improved with Gabapentin and Robaxin.  Patient is also seeing pain management.  Has follow up scheduled and further imaging to be done.

## 2020-11-21 NOTE — Assessment & Plan Note (Signed)
Improved with Gabapentin and Robaxin. Refills sent today. Patient is seeing pain management.

## 2020-11-22 LAB — HEMOGLOBIN A1C
Est. average glucose Bld gHb Est-mCnc: 103 mg/dL
Hgb A1c MFr Bld: 5.2 % (ref 4.8–5.6)

## 2020-11-22 NOTE — Progress Notes (Signed)
Hi Kathy Henry you do not have evidence of diabetes.  We will continue to monitor.

## 2020-12-17 DIAGNOSIS — S24103D Unspecified injury at T7-T10 level of thoracic spinal cord, subsequent encounter: Secondary | ICD-10-CM | POA: Diagnosis not present

## 2020-12-20 DIAGNOSIS — Z419 Encounter for procedure for purposes other than remedying health state, unspecified: Secondary | ICD-10-CM | POA: Diagnosis not present

## 2021-01-20 DIAGNOSIS — Z419 Encounter for procedure for purposes other than remedying health state, unspecified: Secondary | ICD-10-CM | POA: Diagnosis not present

## 2021-01-21 NOTE — Progress Notes (Deleted)
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 kg/m2 Severe obesity (Class II) Increased incidence by 136%  >40 kg/m2 Extreme obesity (Class III) Increased incidence by 254%   Patient's current BMI Ideal Body weight  There is no height or weight on file to calculate BMI. Patient weight not recorded   BMI Readings from Last 4 Encounters:  11/21/20 19.77 kg/m  11/05/20 19.46  kg/m  07/05/20 19.53 kg/m  06/04/20 18.38 kg/m   Wt Readings from Last 4 Encounters:  11/21/20 130 lb (59 kg)  11/05/20 128 lb (58.1 kg)  07/05/20 122 lb 6 oz (55.5 kg)  06/04/20 117 lb 6.4 oz (53.3 kg)    Psych/Mental status: Alert, oriented x 3 (person, place, & time)       Eyes: PERLA Respiratory: No evidence of acute respiratory  distress  Assessment & Plan  Primary Diagnosis & Pertinent Problem List: The primary encounter diagnosis was Chronic low back pain (1ry area of Pain) (Bilateral) (L>R) w/ sciatica (Bilateral). Diagnoses of Chronic lower extremity pain (2ry area of Pain) (Bilateral) (L>R), Chronic neck pain (3ry area of Pain) (Bilateral) (L>R), Chronic hip pain (4th area of Pain) (Bilateral) (L>R), Abnormal drug screen, History of substance use disorder, History of cannabis dependence/abuse (HCC), History of cocaine use, and History of nicotine dependence were also pertinent to this visit.  Visit Diagnosis: 1. Chronic low back pain (1ry area of Pain) (Bilateral) (L>R) w/ sciatica (Bilateral)   2. Chronic lower extremity pain (2ry area of Pain) (Bilateral) (L>R)   3. Chronic neck pain (3ry area of Pain) (Bilateral) (L>R)   4. Chronic hip pain (4th area of Pain) (Bilateral) (L>R)   5. Abnormal drug screen   6. History of substance use disorder   7. History of cannabis dependence/abuse ([place])   8. History of cocaine use   9. History of nicotine dependence    Problems updated and reviewed during this visit: Problem  Chronic low back pain (1ry area of Pain) (Bilateral) (L>R) w/ sciatica (Bilateral)  Chronic lower extremity pain (2ry area of Pain) (Bilateral) (L>R)  Foot drop (Left)  Chronic neck pain (3ry area of Pain) (Bilateral) (L>R)  Chronic hip pain (4th area of Pain) (Bilateral) (L>R)  Chronic hand pain (Bilateral)  Failed Back Surgical Syndrome  Chronic thoracic back pain (Bilateral)  Chronic sacroiliac joint pain (Bilateral)  Lumbar facet joint syndrome (Bilateral)  Chronic lumbar radiculopathy (Left)  Chronic Pain Syndrome  Thoracic Spine Pain  Fall From [place]  Migraines  History of Cannabis Dependence/Abuse (Hcc)  History of Cocaine Use  History of Nicotine Dependence  History of Substance Use Disorder  Abnormal Drug Screen   Positive for cocaine: 08/16/2015; 3-18; 07/15/2016; 12/04/2017;  11/05/2020 Positive for THC: 08/16/2015; 3-18; 07/15/2016; 12/04/2017; 11/05/2020   Pharmacologic Therapy  Disorder of Skeletal System  Problems Influencing Health Status  Insomnia  Tobacco Use Disorder  Cannabis Use Disorder, Severe, Dependence (Hcc)  Cocaine use disorder, mild, abuse (HCC)  Sedative, hypnotic or anxiolytic abuse, episodic (HCC)  Opioid Use Disorder, Moderate, Dependence (Hcc)  Depression  Borderline personality disorder (HCC)  Mdd (Major Depressive Disorder), Recurrent Episode, Severe (Hcc)    Plan of Care  Pharmacotherapy (Medications Ordered): No orders of the defined types were placed in this encounter.  Procedure Orders    No procedure(s) ordered today   Lab Orders  No laboratory test(s) ordered today   Imaging Orders  No imaging studies ordered today   Referral Orders  No referral(s) requested today    Pharmacological management options:  Opioid Analgesics: I will not be prescribing any opioids at this time Membrane stabilizer: I will not be prescribing any at this time Muscle relaxant: I will not be prescribing any at this time NSAID: I will not be prescribing any at this time Other analgesic(s): I will not be prescribing any at this time     Interventional Therapies  Risk  Complexity Considerations:   Estimated body mass index is 19.46 kg/m as calculated from the following:   Height as of this encounter: _0  (1.727 m).   Weight as of this encounter: 128 lb (58.1 kg). WNL   Planned   Pending:   Pending further evaluation   Under consideration:   Diagnostic bilateral lumbar facet block    Completed:   None at this time   Therapeutic   Palliative (PRN) options:   None established     Provider-requested follow-up: No follow-ups on file. Recent Visits Date Type Provider Dept  11/05/20 Office Visit Milinda Pointer, MD [facility]  Showing recent visits within past 90 days and meeting all other requirements Future  Appointments Date Type Provider Dept  01/23/21 Appointment Milinda Pointer, MD [facility]  Showing future appointments within next 90 days and meeting all other requirements  Primary Care Physician: Jon Billings, NP Note by: Gaspar Cola, MD Date: 01/23/2021; Time: 12:18 PM

## 2021-01-23 ENCOUNTER — Ambulatory Visit: Payer: Medicaid Other | Admitting: Pain Medicine

## 2021-01-23 DIAGNOSIS — F1221 Cannabis dependence, in remission: Secondary | ICD-10-CM

## 2021-01-23 DIAGNOSIS — R892 Abnormal level of other drugs, medicaments and biological substances in specimens from other organs, systems and tissues: Secondary | ICD-10-CM

## 2021-01-23 DIAGNOSIS — G8929 Other chronic pain: Secondary | ICD-10-CM

## 2021-01-23 DIAGNOSIS — F1491 Cocaine use, unspecified, in remission: Secondary | ICD-10-CM

## 2021-01-23 DIAGNOSIS — Z87891 Personal history of nicotine dependence: Secondary | ICD-10-CM

## 2021-01-23 DIAGNOSIS — Z87898 Personal history of other specified conditions: Secondary | ICD-10-CM

## 2021-02-12 NOTE — Progress Notes (Deleted)
Called that they of the appointment to cancel.

## 2021-02-13 ENCOUNTER — Ambulatory Visit: Payer: 59 | Attending: Pain Medicine | Admitting: Pain Medicine

## 2021-02-13 ENCOUNTER — Other Ambulatory Visit: Payer: Self-pay | Admitting: Pain Medicine

## 2021-02-13 DIAGNOSIS — Z91199 Patient's noncompliance with other medical treatment and regimen due to unspecified reason: Secondary | ICD-10-CM

## 2021-02-13 DIAGNOSIS — M5030 Other cervical disc degeneration, unspecified cervical region: Secondary | ICD-10-CM | POA: Insufficient documentation

## 2021-02-13 DIAGNOSIS — Z981 Arthrodesis status: Secondary | ICD-10-CM | POA: Insufficient documentation

## 2021-02-13 DIAGNOSIS — S22060S Wedge compression fracture of T7-T8 vertebra, sequela: Secondary | ICD-10-CM | POA: Insufficient documentation

## 2021-02-13 DIAGNOSIS — S22070S Wedge compression fracture of T9-T10 vertebra, sequela: Secondary | ICD-10-CM | POA: Insufficient documentation

## 2021-02-13 DIAGNOSIS — M5134 Other intervertebral disc degeneration, thoracic region: Secondary | ICD-10-CM | POA: Insufficient documentation

## 2021-02-13 DIAGNOSIS — M5020 Other cervical disc displacement, unspecified cervical region: Secondary | ICD-10-CM | POA: Insufficient documentation
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
## 2021-02-20 DIAGNOSIS — Z419 Encounter for procedure for purposes other than remedying health state, unspecified: Secondary | ICD-10-CM | POA: Diagnosis not present

## 2021-02-21 ENCOUNTER — Ambulatory Visit (INDEPENDENT_AMBULATORY_CARE_PROVIDER_SITE_OTHER): Payer: 59 | Admitting: Nurse Practitioner

## 2021-02-21 ENCOUNTER — Other Ambulatory Visit: Payer: Self-pay

## 2021-02-21 ENCOUNTER — Encounter: Payer: Self-pay | Admitting: Nurse Practitioner

## 2021-02-21 VITALS — BP 114/77 | HR 75 | Temp 98.6°F | Wt 137.6 lb

## 2021-02-21 DIAGNOSIS — M5442 Lumbago with sciatica, left side: Secondary | ICD-10-CM

## 2021-02-21 DIAGNOSIS — M5441 Lumbago with sciatica, right side: Secondary | ICD-10-CM

## 2021-02-21 DIAGNOSIS — F332 Major depressive disorder, recurrent severe without psychotic features: Secondary | ICD-10-CM | POA: Diagnosis not present

## 2021-02-21 DIAGNOSIS — G8929 Other chronic pain: Secondary | ICD-10-CM | POA: Diagnosis not present

## 2021-02-21 NOTE — Progress Notes (Signed)
BP 114/77    Pulse 75    Temp 98.6 F (37 C) (Oral)    Wt 137 lb 9.6 oz (62.4 kg)    LMP 02/20/2021 (Exact Date)    SpO2 98%    BMI 20.92 kg/m    Subjective:    Patient ID: Kathy Henry, female    DOB: 09/26/1990, 31 y.o.   MRN: [id number]  HPI: Kathy Henry is a 31 y.o. female  Chief Complaint  Patient presents with   Back Pain   Depression   DEPRESSION/ANXIETY Patient states her depression has been getting a little better.  She recently saw a physical medicine doctor who referred her to a urologist.  It was recommended that she do timed voiding.  She has not gotten into a psychiatrist.  States the Seroquel does help her at bedtime but sometimes it is hard for her to fall asleep.   Flowsheet Row Office Visit from 11/21/2020 in [facility]  PHQ-9 Total Score 27      GAD 7 : Generalized Anxiety Score 11/21/2020 07/05/2020 06/04/2020 05/03/2020  Nervous, Anxious, on Edge 3 2 3 3   Control/stop worrying 3 3 3 3   Worry too much - different things 3 3 3 3   Trouble relaxing 3 3 3 3   Restless 3 2 3 3   Easily annoyed or irritable 3 3 3 3   Afraid - awful might happen 3 3 3 3   Total GAD 7 Score 21 19 21 21   Anxiety Difficulty Extremely difficult Extremely difficult Extremely difficult Extremely difficult    CONSTIPATION Patient states she is speaking with the physical medicine doctor about her constipation.  Still struggling with it as well.    BACK PAIN Patient states she missed her last appt with the pain management doctor.  She recently fell and hit her back and it has been hurting worse lately.  She states her legs are very restless and then her legs start achy.  Relevant past medical, surgical, family and social history reviewed and updated as indicated. Interim medical history since our last visit reviewed. Allergies and medications reviewed and updated.  Review of Systems  Gastrointestinal:  Positive for constipation.  Musculoskeletal:  Positive for back  pain.  Psychiatric/Behavioral:  Positive for dysphoric mood and sleep disturbance. Negative for suicidal ideas. The patient is nervous/anxious.    Per HPI unless specifically indicated above     Objective:    BP 114/77    Pulse 75    Temp 98.6 F (37 C) (Oral)    Wt 137 lb 9.6 oz (62.4 kg)    LMP 02/20/2021 (Exact Date)    SpO2 98%    BMI 20.92 kg/m   Wt Readings from Last 3 Encounters:  02/21/21 137 lb 9.6 oz (62.4 kg)  11/21/20 130 lb (59 kg)  11/05/20 128 lb (58.1 kg)    Physical Exam Vitals and nursing note reviewed.  Constitutional:      General: She is not in acute distress.    Appearance: Normal appearance. She is normal weight. She is not ill-appearing, toxic-appearing or diaphoretic.  HENT:     Head: Normocephalic.     Right Ear: External ear normal.     Left Ear: External ear normal.     Nose: Nose normal.     Mouth/Throat:     Mouth: Mucous membranes are moist.     Pharynx: Oropharynx is clear.  Eyes:     General:  Right eye: No discharge.        Left eye: No discharge.     Extraocular Movements: Extraocular movements intact.     Conjunctiva/sclera: Conjunctivae normal.     Pupils: Pupils are equal, round, and reactive to light.  Cardiovascular:     Rate and Rhythm: Normal rate and regular rhythm.     Heart sounds: No murmur heard. Pulmonary:     Effort: Pulmonary effort is normal. No respiratory distress.     Breath sounds: Normal breath sounds. No wheezing or rales.  Abdominal:     General: Abdomen is flat.     Palpations: Abdomen is soft.  Musculoskeletal:     Cervical back: Normal range of motion and neck supple.  Skin:    General: Skin is warm and dry.     Capillary Refill: Capillary refill takes less than 2 seconds.  Neurological:     General: No focal deficit present.     Mental Status: She is alert and oriented to person, place, and time. Mental status is at baseline.  Psychiatric:        Mood and Affect: Mood normal.        Behavior:  Behavior normal.        Thought Content: Thought content normal.        Judgment: Judgment normal.    Results for orders placed or performed in visit on 11/21/20  HgB A1c  Result Value Ref Range   Hgb A1c MFr Bld 5.2 4.8 - 5.6 %   Est. 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  She is trying to do timed voiding every 2 hours. Discussed that she needs to increase fruits and vegetables and water intake.  Continue to follow up with physical medicine provider.         Other   MDD (major depressive disorder), recurrent episode, severe (HCC) - Primary    Chronic. Ongoing.  PHQ9 and GAD7 improved from last visit.  Still on Seroquel nightly.  Has not seen psychiatry.  Discussed walking into [facility] to be seen.  Can also find someone closer to where she lives in [place].         Follow up plan: Return in about 3 months (around 05/21/2021) for Depression/Anxiety FU.

## 2021-02-21 NOTE — Assessment & Plan Note (Signed)
Has been seeing a physical medicine provider who is working with her on constipation and sent her to urology.  She is trying to do timed voiding every 2 hours. Discussed that she needs to increase fruits and vegetables and water intake.  Continue to follow up with physical medicine provider.

## 2021-02-21 NOTE — Assessment & Plan Note (Signed)
Chronic. Ongoing.  PHQ9 and GAD7 improved from last visit.  Still on Seroquel nightly.  Has not seen psychiatry.  Discussed walking into [facility] to be seen.  Can also find someone closer to where she lives in [place].

## 2021-03-20 DIAGNOSIS — Z419 Encounter for procedure for purposes other than remedying health state, unspecified: Secondary | ICD-10-CM | POA: Diagnosis not present

## 2021-03-21 DIAGNOSIS — N319 Neuromuscular dysfunction of bladder, unspecified: Secondary | ICD-10-CM | POA: Diagnosis not present

## 2021-03-21 DIAGNOSIS — S24103D Unspecified injury at T7-T10 level of thoracic spinal cord, subsequent encounter: Secondary | ICD-10-CM | POA: Diagnosis not present

## 2021-04-20 DIAGNOSIS — Z419 Encounter for procedure for purposes other than remedying health state, unspecified: Secondary | ICD-10-CM | POA: Diagnosis not present

## 2021-04-23 ENCOUNTER — Encounter: Payer: Self-pay | Admitting: Pain Medicine

## 2021-04-23 DIAGNOSIS — E559 Vitamin D deficiency, unspecified: Secondary | ICD-10-CM | POA: Insufficient documentation

## 2021-04-24 DIAGNOSIS — M21372 Foot drop, left foot: Secondary | ICD-10-CM | POA: Diagnosis not present

## 2021-05-06 ENCOUNTER — Encounter: Payer: Self-pay | Admitting: Nurse Practitioner

## 2021-05-07 ENCOUNTER — Other Ambulatory Visit: Payer: Self-pay

## 2021-05-07 DIAGNOSIS — M546 Pain in thoracic spine: Secondary | ICD-10-CM

## 2021-05-07 MED ORDER — GABAPENTIN 400 MG PO CAPS: 400.0000 mg | ORAL_CAPSULE | Freq: Three times a day (TID) | ORAL | 1 refills | Status: DC

## 2021-05-07 MED ORDER — QUETIAPINE FUMARATE 25 MG PO TABS: 25.0000 mg | ORAL_TABLET | Freq: Every day | ORAL | 1 refills | Status: DC

## 2021-05-07 NOTE — Telephone Encounter (Signed)
Patient was last seen in office by yourself on 02/21/21 about depression/anxiety. Patient is requesting an increase in dose of her Seroquel, she states that her current dose is no longer working for her. She would also like to have a refill of her gabapentin. Patient has an up coming appt. With you on 5/3 ?

## 2021-05-07 NOTE — Telephone Encounter (Signed)
Patient would have to be seen to increase the dose of seroquel. ?

## 2021-05-07 NOTE — Telephone Encounter (Signed)
LVM for patient to return call and set up appt.  ?

## 2021-05-08 ENCOUNTER — Telehealth: Payer: Self-pay

## 2021-05-08 NOTE — Telephone Encounter (Signed)
Prior Authorization began for Seroquel.  ? ?Key: [id number]  ?

## 2021-05-10 ENCOUNTER — Telehealth: Payer: Self-pay

## 2021-05-10 NOTE — Telephone Encounter (Signed)
Can discuss with patient at upcoming visit. ?

## 2021-05-10 NOTE — Telephone Encounter (Signed)
PA for Seroquel denied, please advise.  ?

## 2021-05-15 ENCOUNTER — Telehealth: Payer: Self-pay

## 2021-05-15 NOTE — Telephone Encounter (Signed)
LVM regarding denial of PA for Seroquel and that Larae Grooms PA will want to discuss further at next visit.  ?

## 2021-05-20 DIAGNOSIS — Z419 Encounter for procedure for purposes other than remedying health state, unspecified: Secondary | ICD-10-CM | POA: Diagnosis not present

## 2021-05-21 DIAGNOSIS — R051 Acute cough: Secondary | ICD-10-CM | POA: Diagnosis not present

## 2021-05-21 DIAGNOSIS — B9689 Other specified bacterial agents as the cause of diseases classified elsewhere: Secondary | ICD-10-CM | POA: Diagnosis not present

## 2021-05-21 DIAGNOSIS — J208 Acute bronchitis due to other specified organisms: Secondary | ICD-10-CM | POA: Diagnosis not present

## 2021-05-21 DIAGNOSIS — J9801 Acute bronchospasm: Secondary | ICD-10-CM | POA: Diagnosis not present

## 2021-05-21 DIAGNOSIS — M4854XA Collapsed vertebra, not elsewhere classified, thoracic region, initial encounter for fracture: Secondary | ICD-10-CM | POA: Diagnosis not present

## 2021-05-22 ENCOUNTER — Ambulatory Visit: Payer: 59 | Admitting: Nurse Practitioner

## 2021-05-22 DIAGNOSIS — J9801 Acute bronchospasm: Secondary | ICD-10-CM | POA: Diagnosis not present

## 2021-06-03 ENCOUNTER — Ambulatory Visit: Payer: 59 | Admitting: Nurse Practitioner

## 2021-06-03 NOTE — Progress Notes (Deleted)
There were no vitals taken for this visit.   Subjective:    Patient ID: Kathy Henry, female    DOB: 20-Jul-1990, 31 y.o.   MRN: [id number]  HPI: Kathy Henry is a 31 y.o. female  No chief complaint on file.  DEPRESSION/ANXIETY Patient states her depression has been getting a little better.  She recently saw a physical medicine doctor who referred her to a urologist.  It was recommended that she do timed voiding.  She has not gotten into a psychiatrist.  States the Seroquel does help her at bedtime but sometimes it is hard for her to fall asleep.   Flowsheet Row Office Visit from 02/21/2021 in [facility]  PHQ-9 Total Score 18         02/21/2021    2:18 PM 11/21/2020    2:59 PM 07/05/2020   10:56 AM 06/04/2020    9:47 AM  GAD 7 : Generalized Anxiety Score  Nervous, Anxious, on Edge 2 3 2 3   Control/stop worrying 2 3 3 3   Worry too much - different things 2 3 3 3   Trouble relaxing 3 3 3 3   Restless 2 3 2 3   Easily annoyed or irritable 2 3 3 3   Afraid - awful might happen 2 3 3 3   Total GAD 7 Score 15 21 19 21   Anxiety Difficulty Very difficult Extremely difficult Extremely difficult Extremely difficult    CONSTIPATION Patient states she is speaking with the physical medicine doctor about her constipation.  Still struggling with it as well.    BACK PAIN Patient states she missed her last appt with the pain management doctor.  She recently fell and hit her back and it has been hurting worse lately.  She states her legs are very restless and then her legs start achy.  Relevant past medical, surgical, family and social history reviewed and updated as indicated. Interim medical history since our last visit reviewed. Allergies and medications reviewed and updated.  Review of Systems  Gastrointestinal:  Positive for constipation.  Musculoskeletal:  Positive for back pain.  Psychiatric/Behavioral:  Positive for dysphoric mood and sleep disturbance. Negative for  suicidal ideas. The patient is nervous/anxious.    Per HPI unless specifically indicated above     Objective:    There were no vitals taken for this visit.  Wt Readings from Last 3 Encounters:  02/21/21 137 lb 9.6 oz (62.4 kg)  11/21/20 130 lb (59 kg)  11/05/20 128 lb (58.1 kg)    Physical Exam Vitals and nursing note reviewed.  Constitutional:      General: She is not in acute distress.    Appearance: Normal appearance. She is normal weight. She is not ill-appearing, toxic-appearing or diaphoretic.  HENT:     Head: Normocephalic.     Right Ear: External ear normal.     Left Ear: External ear normal.     Nose: Nose normal.     Mouth/Throat:     Mouth: Mucous membranes are moist.     Pharynx: Oropharynx is clear.  Eyes:     General:        Right eye: No discharge.        Left eye: No discharge.     Extraocular Movements: Extraocular movements intact.     Conjunctiva/sclera: Conjunctivae normal.     Pupils: Pupils are equal, round, and reactive to light.  Cardiovascular:     Rate and Rhythm: Normal rate and regular rhythm.  Heart sounds: No murmur heard. Pulmonary:     Effort: Pulmonary effort is normal. No respiratory distress.     Breath sounds: Normal breath sounds. No wheezing or rales.  Abdominal:     General: Abdomen is flat.     Palpations: Abdomen is soft.  Musculoskeletal:     Cervical back: Normal range of motion and neck supple.  Skin:    General: Skin is warm and dry.     Capillary Refill: Capillary refill takes less than 2 seconds.  Neurological:     General: No focal deficit present.     Mental Status: She is alert and oriented to person, place, and time. Mental status is at baseline.  Psychiatric:        Mood and Affect: Mood normal.        Behavior: Behavior normal.        Thought Content: Thought content normal.        Judgment: Judgment normal.    Results for orders placed or performed in visit on 11/21/20  HgB A1c  Result Value Ref Range    Hgb A1c MFr Bld 5.2 4.8 - 5.6 %   Est. average glucose Bld gHb Est-mCnc 103 mg/dL      Assessment & Plan:   Problem List Items Addressed This Visit       Other   MDD (major depressive disorder), recurrent episode, severe (HCC) - Primary     Follow up plan: No follow-ups on file.

## 2021-06-20 DIAGNOSIS — Z419 Encounter for procedure for purposes other than remedying health state, unspecified: Secondary | ICD-10-CM | POA: Diagnosis not present

## 2021-06-27 DIAGNOSIS — S24103D Unspecified injury at T7-T10 level of thoracic spinal cord, subsequent encounter: Secondary | ICD-10-CM | POA: Diagnosis not present

## 2021-06-27 DIAGNOSIS — R0781 Pleurodynia: Secondary | ICD-10-CM | POA: Diagnosis not present

## 2021-07-20 DIAGNOSIS — Z419 Encounter for procedure for purposes other than remedying health state, unspecified: Secondary | ICD-10-CM | POA: Diagnosis not present

## 2021-08-20 DIAGNOSIS — Z419 Encounter for procedure for purposes other than remedying health state, unspecified: Secondary | ICD-10-CM | POA: Diagnosis not present

## 2021-09-20 DIAGNOSIS — Z419 Encounter for procedure for purposes other than remedying health state, unspecified: Secondary | ICD-10-CM | POA: Diagnosis not present

## 2021-09-26 DIAGNOSIS — N319 Neuromuscular dysfunction of bladder, unspecified: Secondary | ICD-10-CM | POA: Diagnosis not present

## 2021-09-26 DIAGNOSIS — S24103D Unspecified injury at T7-T10 level of thoracic spinal cord, subsequent encounter: Secondary | ICD-10-CM | POA: Diagnosis not present

## 2021-09-30 DIAGNOSIS — M62838 Other muscle spasm: Secondary | ICD-10-CM | POA: Diagnosis not present

## 2021-09-30 DIAGNOSIS — S24153D Other incomplete lesion at T7-T10 level of thoracic spinal cord, subsequent encounter: Secondary | ICD-10-CM | POA: Diagnosis not present

## 2021-10-14 NOTE — Progress Notes (Unsigned)
There were no vitals taken for this visit.   Subjective:    Patient ID: Kathy Henry, female    DOB: 1990/12/30, 31 y.o.   MRN: [id number]  HPI: Kathy Henry is a 31 y.o. female  No chief complaint on file.  DEPRESSION/ANXIETY Patient states she has not seen a psychiatrist yet.  The Seroquel is still helping her sleep at night.  She is aware that her symptoms are not well controlled.  Denies SI.   [place] Office Visit from 02/21/2021 in [facility]  PHQ-9 Total Score 18         02/21/2021    2:18 PM 11/21/2020    2:59 PM 07/05/2020   10:56 AM 06/04/2020    9:47 AM  GAD 7 : Generalized Anxiety Score  Nervous, Anxious, on Henry 2 3 2 3   Control/stop worrying 2 3 3 3   Worry too much - different things 2 3 3 3   Trouble relaxing 3 3 3 3   Restless 2 3 2 3   Easily annoyed or irritable 2 3 3 3   Afraid - awful might happen 2 3 3 3   Total GAD 7 Score 15 21 19 21   Anxiety Difficulty Very difficult Extremely difficult Extremely difficult Extremely difficult    CONSTIPATION Patient states she hasn't had a bowel movement in 2 weeks. She has tried Miralax, stool softeners.  States she is drinking about 3 cups of water per day. She does eat vegetables but not a lot of fruit. She took the miralax BID for 3 days and did not have any bowel movement.  BACK PAIN Patient saw pain management. She is going to get xrays and further imaging done.  She has follow up scheduled with them.   Relevant past medical, surgical, family and social history reviewed and updated as indicated. Interim medical history since our last visit reviewed. Allergies and medications reviewed and updated.  Review of Systems  Gastrointestinal:  Positive for constipation.  Musculoskeletal:  Positive for back pain.  Psychiatric/Behavioral:  Positive for dysphoric mood and sleep disturbance. Negative for suicidal ideas. The patient is nervous/anxious.    Per HPI unless specifically indicated above      Objective:    There were no vitals taken for this visit.  Wt Readings from Last 3 Encounters:  02/21/21 137 lb 9.6 oz (62.4 kg)  11/21/20 130 lb (59 kg)  11/05/20 128 lb (58.1 kg)    Physical Exam Vitals and nursing note reviewed.  Constitutional:      General: She is not in acute distress.    Appearance: Normal appearance. She is normal weight. She is not ill-appearing, toxic-appearing or diaphoretic.  HENT:     Head: Normocephalic.     Right Ear: External ear normal.     Left Ear: External ear normal.     Nose: Nose normal.     Mouth/Throat:     Mouth: Mucous membranes are moist.     Pharynx: Oropharynx is clear.  Eyes:     General:        Right eye: No discharge.        Left eye: No discharge.     Extraocular Movements: Extraocular movements intact.     Conjunctiva/sclera: Conjunctivae normal.     Pupils: Pupils are equal, round, and reactive to light.  Cardiovascular:     Rate and Rhythm: Normal rate and regular rhythm.     Heart sounds: No murmur heard. Pulmonary:     Effort:  Pulmonary effort is normal. No respiratory distress.     Breath sounds: Normal breath sounds. No wheezing or rales.  Abdominal:     General: Abdomen is flat. There is distension.     Tenderness: There is abdominal tenderness. There is no right CVA tenderness, left CVA tenderness or guarding.  Musculoskeletal:     Cervical back: Normal range of motion and neck supple.  Skin:    General: Skin is warm and dry.     Capillary Refill: Capillary refill takes less than 2 seconds.  Neurological:     General: No focal deficit present.     Mental Status: She is alert and oriented to person, place, and time. Mental status is at baseline.  Psychiatric:        Mood and Affect: Mood normal.        Behavior: Behavior normal.        Thought Content: Thought content normal.        Judgment: Judgment normal.    Results for orders placed or performed in visit on 11/21/20  HgB A1c  Result Value Ref Range    Hgb A1c MFr Bld 5.2 4.8 - 5.6 %   Est. average glucose Bld gHb Est-mCnc 103 mg/dL      Assessment & Plan:   Problem List Items Addressed This Visit   None    Follow up plan: No follow-ups on file.    A total of 30 minutes were spent on this encounter today.  When total time is documented, this includes both the face-to-face and non-face-to-face time personally spent before, during and after the visit on the date of the encounter reviewing specialist appointments and plan of care for constipation.

## 2021-10-15 ENCOUNTER — Ambulatory Visit (INDEPENDENT_AMBULATORY_CARE_PROVIDER_SITE_OTHER): Payer: Medicaid Other | Admitting: Nurse Practitioner

## 2021-10-15 ENCOUNTER — Telehealth: Payer: Self-pay | Admitting: Nurse Practitioner

## 2021-10-15 ENCOUNTER — Encounter: Payer: Self-pay | Admitting: Nurse Practitioner

## 2021-10-15 VITALS — BP 124/83 | HR 69 | Temp 98.3°F | Wt 137.1 lb

## 2021-10-15 DIAGNOSIS — F332 Major depressive disorder, recurrent severe without psychotic features: Secondary | ICD-10-CM

## 2021-10-15 DIAGNOSIS — M546 Pain in thoracic spine: Secondary | ICD-10-CM | POA: Diagnosis not present

## 2021-10-15 MED ORDER — QUETIAPINE FUMARATE 25 MG PO TABS: 25.0000 mg | ORAL_TABLET | Freq: Every day | ORAL | 0 refills | Status: DC

## 2021-10-15 MED ORDER — GABAPENTIN 400 MG PO CAPS: 400.0000 mg | ORAL_CAPSULE | Freq: Three times a day (TID) | ORAL | 0 refills | Status: DC

## 2021-10-15 MED ORDER — METHOCARBAMOL 500 MG PO TABS: 500.0000 mg | ORAL_TABLET | Freq: Four times a day (QID) | ORAL | 2 refills | Status: DC

## 2021-10-15 NOTE — Telephone Encounter (Signed)
Attempted to call patient regarding message below. [place]

## 2021-10-15 NOTE — Assessment & Plan Note (Signed)
Chronic. Not well controlled.  Does feel like the Seroquel helps her symptoms.  States she was off of it for a little while but has recently gone back on it from a one time refill from her spine doctor.  Understands that she needs to get into see psychiatry. Will help patient get an appt with Psychiatry since it has been difficult for her to make one. Denies intentions of suicide.  Discussed actions to take if symptoms worsen. Follow up in 2 months.  Call sooner if concerns arise.

## 2021-10-15 NOTE — Telephone Encounter (Signed)
Discussed with patient during visit that we will help her try to get an appt with Psychiatry.    After speaking with the referral coordinator she has two options for appts.  She can walk into [facility] on a Friday morning and be seen. Or we can set up an appt with [facility] but they are in [place]. Please let me know which she would prefer.

## 2021-10-15 NOTE — Assessment & Plan Note (Signed)
Chronic. Has not been well controlled.  Has been seeing her spine doctor in [place].  Would like to go back on Gabapentin and Robaxin.  Is working with spine doctor to get back into pain management. Follow up in 2 months.  Call sooner if concerns arise.

## 2021-10-16 ENCOUNTER — Telehealth: Payer: Self-pay

## 2021-10-16 NOTE — Telephone Encounter (Signed)
PA for Seroquel has been initiated via covermymeds. Awaiting determination.   Key: [id number]

## 2021-10-18 ENCOUNTER — Telehealth: Payer: Self-pay

## 2021-10-18 NOTE — Telephone Encounter (Signed)
Pa for Quetiapine Fumarate 25 MG has been approved. Will contact patient later on today to advise of approval.

## 2021-10-20 DIAGNOSIS — Z419 Encounter for procedure for purposes other than remedying health state, unspecified: Secondary | ICD-10-CM | POA: Diagnosis not present

## 2021-10-21 ENCOUNTER — Encounter: Payer: Self-pay | Admitting: Nurse Practitioner

## 2021-11-04 ENCOUNTER — Other Ambulatory Visit: Payer: Self-pay | Admitting: Nurse Practitioner

## 2021-11-04 NOTE — Telephone Encounter (Signed)
Pt called in to request a refill for QUEtiapine (SEROQUEL) 25 MG tablet   Pharmacy:  [facility] [address] [place]), [place] - [address] Phone:  [phone]  Fax:  [phone]      Future appt: 12/16/21

## 2021-11-05 NOTE — Telephone Encounter (Signed)
Requested medication (s) are due for refill today:yes  Requested medication (s) are on the active medication list:yes  Last refill:  10/15/21  Future visit scheduled: yes  Notes to clinic:  Unable to refill per protocol, cannot delegate.      Requested Prescriptions  Pending Prescriptions Disp Refills   QUEtiapine (SEROQUEL) 25 MG tablet 90 tablet 0    Sig: Take 1 tablet (25 mg total) by mouth at bedtime.     Not Delegated - Psychiatry:  Antipsychotics - Second Generation (Atypical) - quetiapine Failed - 11/04/2021 10:48 AM      Failed - This refill cannot be delegated      Failed - TSH in normal range and within 360 days    TSH  Date Value Ref Range Status  08/17/2015 1.824 0.350 - 4.500 uIU/mL Final         Failed - Lipid Panel in normal range within the last 12 months    Cholesterol  Date Value Ref Range Status  08/17/2015 157 0 - 200 mg/dL Final   LDL Cholesterol  Date Value Ref Range Status  08/17/2015 103 (H) 0 - 99 mg/dL Final    Comment:           Total Cholesterol/HDL:CHD Risk Coronary Heart Disease Risk Table                     Men   Women  1/2 Average Risk   3.4   3.3  Average Risk       5.0   4.4  2 X Average Risk   9.6   7.1  3 X Average Risk  23.4   11.0        Use the calculated Patient Ratio above and the CHD Risk Table to determine the patient's CHD Risk.        ATP III CLASSIFICATION (LDL):  <100     mg/dL   Optimal  100-129  mg/dL   Near or Above                    Optimal  130-159  mg/dL   Borderline  160-189  mg/dL   High  >190     mg/dL   Very High    HDL  Date Value Ref Range Status  08/17/2015 43 >40 mg/dL Final   Triglycerides  Date Value Ref Range Status  08/17/2015 54 <150 mg/dL Final         Failed - CBC within normal limits and completed in the last 12 months    WBC  Date Value Ref Range Status  12/04/2017 12.2 (H) 4.0 - 10.5 K/uL Final   RBC  Date Value Ref Range Status  12/04/2017 4.09 3.87 - 5.11 MIL/uL Final    Hemoglobin  Date Value Ref Range Status  12/04/2017 12.7 12.0 - 15.0 g/dL Final   HGB  Date Value Ref Range Status  07/14/2013 13.3 12.0 - 16.0 g/dL Final   HCT  Date Value Ref Range Status  12/04/2017 38.1 36.0 - 46.0 % Final  07/14/2013 38.9 35.0 - 47.0 % Final   MCHC  Date Value Ref Range Status  12/04/2017 33.3 30.0 - 36.0 g/dL Final   [facility]  Date Value Ref Range Status  12/04/2017 31.1 26.0 - 34.0 pg Final   MCV  Date Value Ref Range Status  12/04/2017 93.2 80.0 - 100.0 fL Final  07/14/2013 94 80 - 100 fL Final   No results found  for: "PLTCOUNTKUC", "LABPLAT", "POCPLA" RDW  Date Value Ref Range Status  12/04/2017 13.1 11.5 - 15.5 % Final  07/14/2013 12.9 11.5 - 14.5 % Final         Failed - CMP within normal limits and completed in the last 12 months    Albumin  Date Value Ref Range Status  11/05/2020 4.8 3.8 - 4.8 g/dL Final  07/14/2013 4.4 3.4 - 5.0 g/dL Final   Alkaline Phosphatase  Date Value Ref Range Status  11/05/2020 56 44 - 121 IU/L Final  07/14/2013 38 (L) Unit/L Final    Comment:    45-117 NOTE: New Reference Range 12/10/12    ALT  Date Value Ref Range Status  12/04/2017 18 0 - 44 U/L Final   SGPT (ALT)  Date Value Ref Range Status  07/14/2013 14 12 - 78 U/L Final   AST  Date Value Ref Range Status  11/05/2020 21 0 - 40 IU/L Final   SGOT(AST)  Date Value Ref Range Status  07/14/2013 29 15 - 37 Unit/L Final   BUN  Date Value Ref Range Status  11/05/2020 10 6 - 20 mg/dL Final  07/14/2013 14 7 - 18 mg/dL Final   Calcium  Date Value Ref Range Status  11/05/2020 10.0 8.7 - 10.2 mg/dL Final   Calcium, Total  Date Value Ref Range Status  07/14/2013 8.8 8.5 - 10.1 mg/dL Final   CO2  Date Value Ref Range Status  12/04/2017 25 22 - 32 mmol/L Final   Co2  Date Value Ref Range Status  07/14/2013 26 21 - 32 mmol/L Final   Creatinine  Date Value Ref Range Status  07/14/2013 0.97 0.60 - 1.30 mg/dL Final   Creatinine, Ser   Date Value Ref Range Status  11/05/2020 0.78 0.57 - 1.00 mg/dL Final   Glucose  Date Value Ref Range Status  11/05/2020 130 (H) 70 - 99 mg/dL Final  07/14/2013 80 65 - 99 mg/dL Final   Glucose, Bld  Date Value Ref Range Status  12/04/2017 115 (H) 70 - 99 mg/dL Final   Glucose-Capillary  Date Value Ref Range Status  06/30/2014 80 65 - 99 mg/dL Final   Potassium  Date Value Ref Range Status  11/05/2020 4.5 3.5 - 5.2 mmol/L Final  07/14/2013 3.9 3.5 - 5.1 mmol/L Final   Sodium  Date Value Ref Range Status  11/05/2020 139 134 - 144 mmol/L Final  07/14/2013 137 136 - 145 mmol/L Final   Bilirubin,Total  Date Value Ref Range Status  07/14/2013 0.4 0.2 - 1.0 mg/dL Final   Bilirubin Total  Date Value Ref Range Status  11/05/2020 0.4 0.0 - 1.2 mg/dL Final   Protein, ur  Date Value Ref Range Status  07/15/2016 NEGATIVE NEGATIVE mg/dL Final   Total Protein  Date Value Ref Range Status  11/05/2020 7.4 6.0 - 8.5 g/dL Final  07/14/2013 7.3 6.4 - 8.2 g/dL Final   EGFR (African American)  Date Value Ref Range Status  07/14/2013 >60  Final   GFR calc Af Amer  Date Value Ref Range Status  12/04/2017 >60 >60 mL/min Final    Comment:    (NOTE) The eGFR has been calculated using the CKD EPI equation. This calculation has not been validated in all clinical situations. eGFR's persistently <60 mL/min signify possible Chronic Kidney Disease.    eGFR  Date Value Ref Range Status  11/05/2020 103 >59 mL/min/1.73 Final   EGFR (Non-African Amer.)  Date Value Ref Range Status  07/14/2013 >  60  Final    Comment:    eGFR values <57m/min/1.73 m2 may be an indication of chronic kidney disease (CKD). Calculated eGFR is useful in patients with stable renal function. The eGFR calculation will not be reliable in acutely ill patients when serum creatinine is changing rapidly. It is not useful in  patients on dialysis. The eGFR calculation may not be applicable to patients at the low  and high extremes of body sizes, pregnant women, and vegetarians.    GFR calc non Af Amer  Date Value Ref Range Status  12/04/2017 >60 >60 mL/min Final         Passed - Completed PHQ-2 or PHQ-9 in the last 360 days      Passed - Last BP in normal range    BP Readings from Last 1 Encounters:  10/15/21 124/83         Passed - Last Heart Rate in normal range    Pulse Readings from Last 1 Encounters:  10/15/21 69         Passed - Valid encounter within last 6 months    Recent Outpatient Visits           3 weeks ago Severe episode of recurrent major depressive disorder, without psychotic features ([place]   [facility]   8 months ago Severe episode of recurrent major depressive disorder, without psychotic features ([place]   [facility]   11 months ago Chronic low back pain (1ry area of Pain) (Bilateral) (L>R) w/ sciatica (Bilateral)   [facility]   1 year ago Severe episode of recurrent major depressive disorder, without psychotic features ([place]   [facility]   1 year ago Severe episode of recurrent major depressive disorder, without psychotic features ([place]   [facility]       Future Appointments             In 1 month [facility]

## 2021-11-20 DIAGNOSIS — Z419 Encounter for procedure for purposes other than remedying health state, unspecified: Secondary | ICD-10-CM | POA: Diagnosis not present

## 2021-12-11 ENCOUNTER — Other Ambulatory Visit: Payer: Self-pay

## 2021-12-11 ENCOUNTER — Encounter: Payer: Self-pay | Admitting: *Deleted

## 2021-12-11 ENCOUNTER — Emergency Department: Payer: Medicare Other

## 2021-12-11 ENCOUNTER — Emergency Department
Admission: EM | Admit: 2021-12-11 | Discharge: 2021-12-11 | Disposition: A | Discharge status: Home / Self Care | Payer: Medicare Other | Attending: Student in an Organized Health Care Education/Training Program | Admitting: Student in an Organized Health Care Education/Training Program

## 2021-12-11 DIAGNOSIS — M5441 Lumbago with sciatica, right side: Secondary | ICD-10-CM | POA: Insufficient documentation

## 2021-12-11 DIAGNOSIS — M5442 Lumbago with sciatica, left side: Secondary | ICD-10-CM | POA: Diagnosis not present

## 2021-12-11 DIAGNOSIS — M545 Low back pain, unspecified: Secondary | ICD-10-CM | POA: Diagnosis not present

## 2021-12-11 DIAGNOSIS — M5416 Radiculopathy, lumbar region: Secondary | ICD-10-CM | POA: Diagnosis not present

## 2021-12-11 DIAGNOSIS — G8929 Other chronic pain: Secondary | ICD-10-CM

## 2021-12-11 MED ORDER — ACETAMINOPHEN 500 MG PO TABS
1000.0000 mg | ORAL_TABLET | Freq: Once | ORAL | Status: AC
  Administered 2021-12-11: 1000 mg via ORAL
  Filled 2021-12-11: qty 2

## 2021-12-11 MED ORDER — CYCLOBENZAPRINE HCL 10 MG PO TABS: 10.0000 mg | ORAL_TABLET | Freq: Three times a day (TID) | ORAL | 0 refills | Status: DC | PRN

## 2021-12-11 MED ORDER — KETOROLAC TROMETHAMINE 30 MG/ML IJ SOLN
30.0000 mg | Freq: Once | INTRAMUSCULAR | Status: AC
  Administered 2021-12-11: 30 mg via INTRAMUSCULAR
  Filled 2021-12-11: qty 1

## 2021-12-11 NOTE — ED Notes (Signed)
See triage note. Pt in with chronic back pain. States since surgery pain has been steady and recently couldn't tolerate it. Pt's cane at bedside. Pt's resp reg/unlabored, skin dry and calmly sitting on stretcher.  Pt leaving for imaging. Will update vitals once pt back to room.

## 2021-12-11 NOTE — ED Triage Notes (Signed)
Pt has back pain for 1 week.  No known injury.  Hx back surgery.  Pt alert.  Pt ambulates with a cane.

## 2021-12-11 NOTE — Discharge Instructions (Addendum)
-  Patient to the educational tool provided.  Recommend the rehab exercises to help with your symptoms.  -You may continue taking your prescribed medications as needed for your pain.  You may additionally take the cyclobenzaprine, though use caution as it may make you dizzy/drowsy.  If it makes you too drowsy during the day, you can opt to just taking 1 before bed at night.  -Return to the emergency department anytime if you begin to experience any new or worsening symptoms.

## 2021-12-11 NOTE — ED Notes (Signed)
Pt received discharge papers and verbalized understanding of discharge instructions. Pt ambulates to exit with steady gait  

## 2021-12-11 NOTE — ED Provider Notes (Signed)
[facility] Provider Note    Event Date/Time   First MD Initiated Contact with Patient 12/11/21 2130     (approximate)   History   Chief Complaint Back Pain   HPI Kathy Henry is a 31 y.o. female, history of bipolar 1, OCD, anxiety/depression, chronic back pain, degenerative disc disease, cocaine use, cannabis use, borderline personality disorder, opiate use disorder, history of thoracic spinal fusion T6-T11, presents to the emergency department for evaluation of low back pain.  She states that she has been having ongoing back pain with radiation into both of her legs for the past week.  She has a history of pain like this and has flares occasionally.  She states the last time she was here she was given Toradol which helped her symptoms significantly.  She says that she has an appointment scheduled with her primary care provider next week, but she cannot wait.  Denies fever/chills, saddle anesthesia, bowel/bladder dysfunction, chest pain, shortness of breath, abdominal pain, nausea/vomiting, diarrhea, headache, weakness, vision change, hearing changes, or rash/lesions.  History Limitations: No limitations.        Physical Exam  Triage Vital Signs: ED Triage Vitals  Enc Vitals Group     BP 12/11/21 2110 (!) 149/63     Pulse Rate 12/11/21 2110 78     Resp 12/11/21 2110 18     Temp 12/11/21 2110 98.2 F (36.8 C)     Temp Source 12/11/21 2110 Oral     SpO2 12/11/21 2110 100 %     Weight 12/11/21 2109 136 lb 11 oz (62 kg)     Height 12/11/21 2109 5\' 8"  (1.727 m)     Head Circumference --      Peak Flow --      Pain Score 12/11/21 2109 10     Pain Loc --      Pain Edu? --      Excl. in GC? --     Most recent vital signs: Vitals:   12/11/21 2110 12/11/21 2311  BP: (!) 149/63 123/77  Pulse: 78 67  Resp: 18 17  Temp: 98.2 F (36.8 C) 98.8 F (37.1 C)  SpO2: 100% 98%    General: Awake, NAD.  Skin: Warm, dry. No rashes or lesions.  Eyes:  PERRL. Conjunctivae normal.  CV: Good peripheral perfusion.  Resp: Normal effort.  Abd: Soft, non-tender. No distention.  Neuro: At baseline. No gross neurological deficits.  Musculoskeletal: Normal ROM of all extremities.  Focused Exam: Mild midline spinal tenderness along the L5 region.  Pain elicited with straight leg test bilaterally.  Physical Exam    ED Results / Procedures / Treatments  Labs (all labs ordered are listed, but only abnormal results are displayed) Labs Reviewed - No data to display   EKG N/A.    RADIOLOGY  ED Provider Interpretation: I personally reviewed and interpreted this CT scan.  Mild disc bulging at L4/L5 and L5/S1.  CT Lumbar Spine Wo Contrast  Result Date: 12/11/2021 CLINICAL DATA:  Lumbar radiculopathy. Back pain for 1 week. History of back surgery. EXAM: CT LUMBAR SPINE WITHOUT CONTRAST TECHNIQUE: Multidetector CT imaging of the lumbar spine was performed without intravenous contrast administration. Multiplanar CT image reconstructions were also generated. RADIATION DOSE REDUCTION: This exam was performed according to the departmental dose-optimization program which includes automated exposure control, adjustment of the mA and/or kV according to patient size and/or use of iterative reconstruction technique. COMPARISON:  None Available. FINDINGS: Segmentation: 5 lumbar  type vertebrae. Alignment: Normal. Vertebrae: No acute fracture or focal pathologic process. Paraspinal and other soft tissues: Negative. Disc levels: T12-L1: No significant disc bulge spinal canal or neural foraminal stenosis. L1-L2: No significant disc bulge, spinal canal or neural foraminal stenosis. L2-L3: No significant disc bulge, spinal canal or neural foraminal stenosis. L3-L4: No significant disc bulge, spinal canal or neural foraminal stenosis. L4-L5: Mild disc bulge with mild bilateral lateral recess stenosis. Mild facet joint arthropathy. No significant neural foraminal  stenosis. L5-S1: Disc height loss and mild disc bulge with mild lateral recess stenosis. Significant neural foraminal stenosis. Mild facet joint arthropathy. IMPRESSION: 1. Mild disc bulge at L4-L5 with mild bilateral lateral recess stenosis. 2. Disc height loss and mild disc bulge at L5-S1 with mild bilateral lateral recess stenosis. 3. Mild facet joint arthropathy at L4-L5 and L5-S1. 4. No acute fracture or subluxation. Electronically Signed   By: Larose Hires D.O.   On: 12/11/2021 23:05    PROCEDURES:  Critical Care performed: N/A.  Procedures    MEDICATIONS ORDERED IN ED: Medications  ketorolac (TORADOL) 30 MG/ML injection 30 mg (30 mg Intramuscular Given 12/11/21 2238)  acetaminophen (TYLENOL) tablet 1,000 mg (1,000 mg Oral Given 12/11/21 2237)     IMPRESSION / MDM / ASSESSMENT AND PLAN / ED COURSE  I reviewed the triage vital signs and the nursing notes.                              Differential diagnosis includes, but is not limited to, disc herniation, lumbar radiculopathy, sciatica, lumbosacral strain.  Assessment/Plan Patient presents with chronic back pain with evidence suggestive of sciatica bilaterally.  Patient appears well clinically.  Her CT scan does show evidence of mild disc bulging at L4/L5/S1, which is likely the source of her symptoms.  She is otherwise able to ambulate well on her own.  She states that she was alleviated by the IM ketorolac provided earlier and was extremely grateful.  She is currently already taking gabapentin, meloxicam, and Tylenol at home.  We will provide her with a prescription for cyclobenzaprine to take at home as well.  Encouraged her to continue following through with her appointment with her PCP next week.  She was amenable to this.  Will discharge.  Provided the patient with anticipatory guidance, return precautions, and educational material. Encouraged the patient to return to the emergency department at any time if they begin to  experience any new or worsening symptoms. Patient expressed understanding and agreed with the plan.   Patient's presentation is most consistent with acute complicated illness / injury requiring diagnostic workup.       FINAL CLINICAL IMPRESSION(S) / ED DIAGNOSES   Final diagnoses:  Chronic bilateral low back pain with bilateral sciatica     Rx / DC Orders   ED Discharge Orders          Ordered    cyclobenzaprine (FLEXERIL) 10 MG tablet  3 times daily PRN        12/11/21 2320             Note:  This document was prepared using Dragon voice recognition software and may include unintentional dictation errors.   Varney Daily, [place] 12/11/21 2324    Willy Eddy, MD 12/11/21 [phone]

## 2021-12-16 ENCOUNTER — Ambulatory Visit (INDEPENDENT_AMBULATORY_CARE_PROVIDER_SITE_OTHER): Payer: Medicaid Other | Admitting: Nurse Practitioner

## 2021-12-16 ENCOUNTER — Encounter: Payer: Self-pay | Admitting: Nurse Practitioner

## 2021-12-16 VITALS — BP 113/69 | HR 73 | Temp 98.0°F | Wt 137.8 lb

## 2021-12-16 DIAGNOSIS — Z23 Encounter for immunization: Secondary | ICD-10-CM | POA: Diagnosis not present

## 2021-12-16 DIAGNOSIS — F603 Borderline personality disorder: Secondary | ICD-10-CM

## 2021-12-16 DIAGNOSIS — S22060S Wedge compression fracture of T7-T8 vertebra, sequela: Secondary | ICD-10-CM

## 2021-12-16 DIAGNOSIS — S22070S Wedge compression fracture of T9-T10 vertebra, sequela: Secondary | ICD-10-CM | POA: Diagnosis not present

## 2021-12-16 DIAGNOSIS — G894 Chronic pain syndrome: Secondary | ICD-10-CM | POA: Diagnosis not present

## 2021-12-16 DIAGNOSIS — M5136 Other intervertebral disc degeneration, lumbar region: Secondary | ICD-10-CM | POA: Diagnosis not present

## 2021-12-16 DIAGNOSIS — M546 Pain in thoracic spine: Secondary | ICD-10-CM | POA: Diagnosis not present

## 2021-12-16 MED ORDER — QUETIAPINE FUMARATE 25 MG PO TABS: 25.0000 mg | ORAL_TABLET | Freq: Every day | ORAL | 0 refills | Status: DC

## 2021-12-16 MED ORDER — GABAPENTIN 400 MG PO CAPS: 400.0000 mg | ORAL_CAPSULE | Freq: Three times a day (TID) | ORAL | 0 refills | Status: DC

## 2021-12-16 MED ORDER — KETOROLAC TROMETHAMINE 60 MG/2ML IM SOLN
60.0000 mg | Freq: Once | INTRAMUSCULAR | Status: AC
  Administered 2021-12-16: 60 mg via INTRAMUSCULAR

## 2021-12-16 MED ORDER — CYCLOBENZAPRINE HCL 10 MG PO TABS: 10.0000 mg | ORAL_TABLET | Freq: Three times a day (TID) | ORAL | 0 refills | Status: DC | PRN

## 2021-12-16 NOTE — Assessment & Plan Note (Signed)
Chronic. Ongoing.  Not well controlled.  Has not seen new pain management.  Will place referral for Ortho due to new bulging disc found.  Hoping she can see pain management [facility].  Also recommend following up with spine surgeon.  Toradol shot given in office today.

## 2021-12-16 NOTE — Progress Notes (Signed)
BP 113/69   Pulse 73   Temp 98 F (36.7 C) (Oral)   Wt 137 lb 12.8 oz (62.5 kg)   LMP 10/30/2021 (Approximate)   SpO2 99%   BMI 20.95 kg/m    Subjective:    Patient ID: Kathy Henry, female    DOB: [birth year], 31 y.o.   MRN: [id number]  HPI: Kathy Henry is a 31 y.o. female  Chief Complaint  Patient presents with   Depression   Pain    Pt states she went to the ER this past weekend for pain   DEPRESSION/ANXIETY Patient states she has not seen a psychiatrist yet.  The Seroquel is still helping her sleep at night. She is aware that her symptoms are not well controlled.  Is okay if we make the appt for her and let her know when it is.  Does not have thoughts of harming herself but does have thoughts that she would be better off dead.  No plans to commit suicide.  Flowsheet Row Office Visit from 12/16/2021 in [facility]  PHQ-9 Total Score 20         12/16/2021   10:35 AM 10/15/2021   11:00 AM 02/21/2021    2:18 PM 11/21/2020    2:59 PM  GAD 7 : Generalized Anxiety Score  Nervous, Anxious, on Edge 2 3 2 3   Control/stop worrying 2 3 2 3   Worry too much - different things 2 3 2 3   Trouble relaxing 2 3 3 3   Restless 2 3 2 3   Easily annoyed or irritable 2 2 2 3   Afraid - awful might happen 2 2 2 3   Total GAD 7 Score 14 19 15 21   Anxiety Difficulty Very difficult Not difficult at all Very difficult Extremely difficult     BACK PAIN Patient states her back has been hurting.  She was seen in the ER on 11/22.  She was given Toradol injection which really helped her pain.  Patient is still taking Gabapentin and Methocarbamol.  She was having trouble being stable on her legs when the pain was happening. She does have a bulging disc in her lower back.  Patient states she didn't get the referral to pain management from her spine doctor.   Relevant past medical, surgical, family and social history reviewed and updated as indicated. Interim medical history since our  last visit reviewed. Allergies and medications reviewed and updated.  Review of Systems  Musculoskeletal:  Positive for back pain.  Psychiatric/Behavioral:  Positive for dysphoric mood, sleep disturbance and suicidal ideas. The patient is nervous/anxious.     Per HPI unless specifically indicated above     Objective:    BP 113/69   Pulse 73   Temp 98 F (36.7 C) (Oral)   Wt 137 lb 12.8 oz (62.5 kg)   LMP 10/30/2021 (Approximate)   SpO2 99%   BMI 20.95 kg/m   Wt Readings from Last 3 Encounters:  12/16/21 137 lb 12.8 oz (62.5 kg)  12/11/21 136 lb 11 oz (62 kg)  10/15/21 137 lb 1.6 oz (62.2 kg)    Physical Exam Vitals and nursing note reviewed.  Constitutional:      General: She is not in acute distress.    Appearance: Normal appearance. She is normal weight. She is not ill-appearing, toxic-appearing or diaphoretic.  HENT:     Head: Normocephalic.     Right Ear: External ear normal.     Left Ear: External ear normal.  Nose: Nose normal.     Mouth/Throat:     Mouth: Mucous membranes are moist.     Pharynx: Oropharynx is clear.  Eyes:     General:        Right eye: No discharge.        Left eye: No discharge.     Extraocular Movements: Extraocular movements intact.     Conjunctiva/sclera: Conjunctivae normal.     Pupils: Pupils are equal, round, and reactive to light.  Cardiovascular:     Rate and Rhythm: Normal rate and regular rhythm.     Heart sounds: No murmur heard. Pulmonary:     Effort: Pulmonary effort is normal. No respiratory distress.     Breath sounds: Normal breath sounds. No wheezing or rales.  Abdominal:     General: Abdomen is flat. There is no distension.     Tenderness: There is no abdominal tenderness. There is no right CVA tenderness, left CVA tenderness or guarding.  Musculoskeletal:     Cervical back: Normal range of motion and neck supple.     Comments: Walks with cane and wears brace on left leg  Skin:    General: Skin is warm and dry.      Capillary Refill: Capillary refill takes less than 2 seconds.  Neurological:     General: No focal deficit present.     Mental Status: She is alert and oriented to person, place, and time. Mental status is at baseline.  Psychiatric:        Mood and Affect: Mood normal.        Behavior: Behavior normal.        Thought Content: Thought content normal.        Judgment: Judgment normal.     Results for orders placed or performed in visit on 11/21/20  HgB A1c  Result Value Ref Range   Hgb A1c MFr Bld 5.2 4.8 - 5.6 %   Est. average glucose Bld gHb Est-mCnc 103 mg/dL      Assessment & Plan:   Problem List Items Addressed This Visit       Musculoskeletal and Integument   Traumatic compression fracture of T9 thoracic vertebra, sequela (Chronic)   Relevant Orders   For home use only DME Other see comment   Traumatic compression fracture of T8 thoracic vertebra, sequela (Chronic)   Relevant Orders   For home use only DME Other see comment   Traumatic compression fracture of T10 thoracic vertebra, sequela (Chronic)   Relevant Orders   For home use only DME Other see comment     Other   Chronic pain syndrome (Chronic)    Chronic. Ongoing.  Not well controlled.  Has not seen new pain management.  Will place referral for Ortho due to new bulging disc found.  Hoping she can see pain management [facility].  Also recommend following up with spine surgeon.  Toradol shot given in office today.       Relevant Medications   gabapentin (NEURONTIN) 400 MG capsule   cyclobenzaprine (FLEXERIL) 10 MG tablet   Borderline personality disorder (HCC) - Primary    Improved with Seroquel. Depression and Anxiety are not well controlled but patient is sleeping better. Patient has not seen psychiatry.  She currently is homeless and doesn't have a car.  It is difficult for her to get to appointments but she agrees to see one virtually. Denies SI. Follow up in 3 months.  Call sooner if concerns arise.  Thoracic spine pain   Relevant Medications   gabapentin (NEURONTIN) 400 MG capsule   cyclobenzaprine (FLEXERIL) 10 MG tablet   Other Visit Diagnoses     Need for influenza vaccination       Relevant Orders   Flu Vaccine QUAD 14mo+IM (Fluarix, Fluzone & Alfiuria Quad PF) (Completed)   L4-L5 disc bulge       Relevant Orders   Ambulatory referral to Orthopedics        Follow up plan: Return in about 3 months (around 03/18/2022) for HTN, HLD, DM2 FU.    A total of 30 minutes were spent on this encounter today.  When total time is documented, this includes both the face-to-face and non-face-to-face time personally spent before, during and after the visit on the date of the encounter reviewing specialist appointments and plan of care for pain.

## 2021-12-16 NOTE — Assessment & Plan Note (Signed)
Improved with Seroquel. Depression and Anxiety are not well controlled but patient is sleeping better. Patient has not seen psychiatry.  She currently is homeless and doesn't have a car.  It is difficult for her to get to appointments but she agrees to see one virtually. Denies SI. Follow up in 3 months.  Call sooner if concerns arise.

## 2021-12-20 DIAGNOSIS — Z419 Encounter for procedure for purposes other than remedying health state, unspecified: Secondary | ICD-10-CM | POA: Diagnosis not present

## 2021-12-30 DIAGNOSIS — Z87828 Personal history of other (healed) physical injury and trauma: Secondary | ICD-10-CM | POA: Diagnosis not present

## 2021-12-30 DIAGNOSIS — R202 Paresthesia of skin: Secondary | ICD-10-CM | POA: Diagnosis not present

## 2021-12-30 DIAGNOSIS — N319 Neuromuscular dysfunction of bladder, unspecified: Secondary | ICD-10-CM | POA: Diagnosis not present

## 2022-01-03 DIAGNOSIS — M5416 Radiculopathy, lumbar region: Secondary | ICD-10-CM | POA: Diagnosis not present

## 2022-01-20 DIAGNOSIS — Z419 Encounter for procedure for purposes other than remedying health state, unspecified: Secondary | ICD-10-CM | POA: Diagnosis not present

## 2022-01-22 ENCOUNTER — Encounter: Payer: Self-pay | Admitting: Nurse Practitioner

## 2022-01-22 MED ORDER — CYCLOBENZAPRINE HCL 10 MG PO TABS: 10.0000 mg | ORAL_TABLET | Freq: Three times a day (TID) | ORAL | 0 refills | Status: DC | PRN

## 2022-01-23 ENCOUNTER — Ambulatory Visit (INDEPENDENT_AMBULATORY_CARE_PROVIDER_SITE_OTHER): Payer: Medicaid Other | Admitting: Physician Assistant

## 2022-01-23 ENCOUNTER — Encounter: Payer: Self-pay | Admitting: Physician Assistant

## 2022-01-23 VITALS — BP 125/85 | HR 96 | Temp 98.8°F | Ht 67.99 in | Wt 140.8 lb

## 2022-01-23 DIAGNOSIS — R062 Wheezing: Secondary | ICD-10-CM

## 2022-01-23 DIAGNOSIS — R051 Acute cough: Secondary | ICD-10-CM | POA: Diagnosis not present

## 2022-01-23 MED ORDER — PROMETHAZINE-DM 6.25-15 MG/5ML PO SYRP: 5.0000 mL | ORAL_SOLUTION | Freq: Four times a day (QID) | ORAL | 0 refills | Status: DC | PRN

## 2022-01-23 MED ORDER — PREDNISONE 20 MG PO TABS: 40.0000 mg | ORAL_TABLET | Freq: Every day | ORAL | 0 refills | Status: AC

## 2022-01-23 NOTE — Patient Instructions (Signed)
Please go here for your  xray   [address] [place], [place] [id number]    Please start the Prednisone tomorrow morning as it can cause sleeplessness Depending on the results of your Chest xray I may need to send in antibiotics for pneumonia- we will keep you updated once this is complete and we have the results back

## 2022-01-23 NOTE — Progress Notes (Signed)
Acute Office Visit   Patient: Kathy Henry   DOB: [birth year]   32 y.o. Female  MRN: [id number] Visit Date: 01/23/2022  Today's healthcare provider: Dani Gobble Norelle Runnion, PA-C  Introduced myself to the patient as a Journalist, newspaper and provided education on APPs in clinical practice.    Chief Complaint  Patient presents with   Cough    Wants a cough med   Subjective    Cough Associated symptoms include myalgias, rhinorrhea and wheezing. Pertinent negatives include no chills, ear pain, fever, postnasal drip, sore throat or shortness of breath.   HPI     Cough    Additional comments: Wants a cough med      Last edited by Jerelene Redden, CMA on 01/23/2022  4:29 PM.      She reports she had a cold around the beginning of last week and is unable to get rid of cough She reports it is a dry cough and has noted some wheezing  She states it gets worse at night Interventions: OTC cough medication She reports she has a nebulizer at home that she got from [facility] when she was diagnosed with RSV but it is not helping with cough     Medications: Outpatient Medications Prior to Visit  Medication Sig   acetaminophen (TYLENOL) 500 MG tablet Take 500 mg by mouth every 6 (six) hours as needed.   cyclobenzaprine (FLEXERIL) 10 MG tablet Take 1 tablet (10 mg total) by mouth 3 (three) times daily as needed.   gabapentin (NEURONTIN) 400 MG capsule Take 1 capsule (400 mg total) by mouth 3 (three) times daily.   QUEtiapine (SEROQUEL) 25 MG tablet Take 1 tablet (25 mg total) by mouth at bedtime.   No facility-administered medications prior to visit.    Review of Systems  Constitutional:  Negative for chills and fever.  HENT:  Positive for congestion and rhinorrhea. Negative for ear pain, postnasal drip and sore throat.   Respiratory:  Positive for cough and wheezing. Negative for chest tightness and shortness of breath.   Gastrointestinal:  Negative for diarrhea, nausea and vomiting.   Musculoskeletal:  Positive for myalgias.       Objective    BP 125/85   Pulse 96   Temp 98.8 F (37.1 C) (Oral)   Ht 5' 7.99" (1.727 m)   Wt 140 lb 12.8 oz (63.9 kg)   SpO2 96%   BMI 21.41 kg/m    Physical Exam Vitals reviewed.  Constitutional:      General: She is awake.     Appearance: Normal appearance. She is well-developed and well-groomed.  HENT:     Head: Normocephalic and atraumatic.  Eyes:     General: Lids are normal. Gaze aligned appropriately.     Extraocular Movements: Extraocular movements intact.     Conjunctiva/sclera: Conjunctivae normal.  Cardiovascular:     Rate and Rhythm: Regular rhythm. Tachycardia present.     Pulses: Normal pulses.     Heart sounds: Normal heart sounds. No murmur heard.    No friction rub. No gallop.  Pulmonary:     Effort: Pulmonary effort is normal.     Breath sounds: No decreased air movement. Decreased breath sounds, wheezing and rhonchi present. No rales.  Musculoskeletal:     Cervical back: Normal range of motion.  Neurological:     Mental Status: She is alert.  Psychiatric:        Mood and Affect: Mood  normal.        Behavior: Behavior normal. Behavior is cooperative.        Thought Content: Thought content normal.        Judgment: Judgment normal.       No results found for any visits on 01/23/22.  Assessment & Plan      No follow-ups on file.      Problem List Items Addressed This Visit   None Visit Diagnoses     Wheezing on auscultation    -  Primary Acute, new concern Likely secondary to suspected viral illness that she is recovering from  Wheezing is generalized through most lung fields and does not improve with coughing or clearing throat Will order CXR- negative for acute cardiopulmonary concerns at this time Will send in script for Prednisone 40 mg PO QD x 7 days to assist with symptoms Reviewed OTC medications for cough and symptom relief Follow up as needed for persistent or progressing  symptoms    Relevant Medications   predniSONE (DELTASONE) 20 MG tablet   Other Relevant Orders   DG Chest 2 View (Completed)   Acute cough     Acute, lingering cough likely secondary to acute viral illness Will send in script for Promethazine-DM to assist with coughing at night and Prednisone for wheezing and suspected bronchospasms    Relevant Medications   promethazine-dextromethorphan (PROMETHAZINE-DM) 6.25-15 MG/5ML syrup        No follow-ups on file.   I, Neil Brickell E Coti Burd, PA-C, have reviewed all documentation for this visit. The documentation on 01/27/22 for the exam, diagnosis, procedures, and orders are all accurate and complete.   Talitha Givens, MHS, PA-C [facility]

## 2022-01-24 ENCOUNTER — Ambulatory Visit
Admission: RE | Admit: 2022-01-24 | Discharge: 2022-01-24 | Disposition: A | Discharge status: Home / Self Care | Payer: Medicaid Other | Source: Ambulatory Visit | Attending: Physician Assistant | Admitting: Physician Assistant

## 2022-01-24 DIAGNOSIS — R062 Wheezing: Secondary | ICD-10-CM

## 2022-01-24 DIAGNOSIS — R0602 Shortness of breath: Secondary | ICD-10-CM | POA: Diagnosis not present

## 2022-01-24 DIAGNOSIS — R059 Cough, unspecified: Secondary | ICD-10-CM | POA: Diagnosis not present

## 2022-01-29 DIAGNOSIS — M545 Low back pain, unspecified: Secondary | ICD-10-CM | POA: Diagnosis not present

## 2022-02-05 DIAGNOSIS — R293 Abnormal posture: Secondary | ICD-10-CM | POA: Diagnosis not present

## 2022-02-05 DIAGNOSIS — M5459 Other low back pain: Secondary | ICD-10-CM | POA: Diagnosis not present

## 2022-02-12 DIAGNOSIS — M1990 Unspecified osteoarthritis, unspecified site: Secondary | ICD-10-CM | POA: Diagnosis not present

## 2022-02-12 DIAGNOSIS — M5416 Radiculopathy, lumbar region: Secondary | ICD-10-CM | POA: Diagnosis not present

## 2022-02-25 ENCOUNTER — Telehealth: Payer: Self-pay | Admitting: Nurse Practitioner

## 2022-02-25 DIAGNOSIS — M546 Pain in thoracic spine: Secondary | ICD-10-CM

## 2022-02-25 NOTE — Telephone Encounter (Signed)
Medication Refill - Medication: QUEtiapine (SEROQUEL) 25 MG tablet  gabapentin (NEURONTIN) 400 MG capsule  Has the patient contacted their pharmacy? Yes.   (Agent: If no, request that the patient contact the pharmacy for the refill. If patient does not wish to contact the pharmacy document the reason why and proceed with request.) (Agent: If yes, when and what did the pharmacy advise?)  Preferred Pharmacy (with phone number or street name):   [facility] ([facility] [place] [address]  [place] [address]  Phone: [phone] Fax: [phone]   Has the patient been seen for an appointment in the last year OR does the patient have an upcoming appointment? Yes.    Agent: Please be advised that RX refills may take up to 3 business days. We ask that you follow-up with your pharmacy.

## 2022-02-26 NOTE — Telephone Encounter (Signed)
Requested medication (s) are due for refill today: yes  Requested medication (s) are on the active medication list: yes  Last refill:  seroquel- 12/16/21 #90 0 refills, neurontin- 12/16/21 #270 0 refills  Future visit scheduled: yes in 2 weeks   Notes to clinic:  not delegated per protocol. Protocol failed.  Last labs 11/05/20. Do you want to refill Rx?     Requested Prescriptions  Pending Prescriptions Disp Refills   QUEtiapine (SEROQUEL) 25 MG tablet 90 tablet 0    Sig: Take 1 tablet (25 mg total) by mouth at bedtime.     Not Delegated - Psychiatry:  Antipsychotics - Second Generation (Atypical) - quetiapine Failed - 02/25/2022  2:58 PM      Failed - This refill cannot be delegated      Failed - TSH in normal range and within 360 days    TSH  Date Value Ref Range Status  08/17/2015 1.824 0.350 - 4.500 uIU/mL Final         Failed - Lipid Panel in normal range within the last 12 months    Cholesterol  Date Value Ref Range Status  08/17/2015 157 0 - 200 mg/dL Final   LDL Cholesterol  Date Value Ref Range Status  08/17/2015 103 (H) 0 - 99 mg/dL Final    Comment:           Total Cholesterol/HDL:CHD Risk Coronary Heart Disease Risk Table                     Men   Women  1/2 Average Risk   3.4   3.3  Average Risk       5.0   4.4  2 X Average Risk   9.6   7.1  3 X Average Risk  23.4   11.0        Use the calculated Patient Ratio above and the CHD Risk Table to determine the patient's CHD Risk.        ATP III CLASSIFICATION (LDL):  <100     mg/dL   Optimal  742-595  mg/dL   Near or Above                    Optimal  130-159  mg/dL   Borderline  638-756  mg/dL   High  >433     mg/dL   Very High    HDL  Date Value Ref Range Status  08/17/2015 43 >40 mg/dL Final   Triglycerides  Date Value Ref Range Status  08/17/2015 54 <150 mg/dL Final         Failed - CBC within normal limits and completed in the last 12 months    WBC  Date Value Ref Range Status   12/04/2017 12.2 (H) 4.0 - 10.5 K/uL Final   RBC  Date Value Ref Range Status  12/04/2017 4.09 3.87 - 5.11 MIL/uL Final   Hemoglobin  Date Value Ref Range Status  12/04/2017 12.7 12.0 - 15.0 g/dL Final   HGB  Date Value Ref Range Status  07/14/2013 13.3 12.0 - 16.0 g/dL Final   HCT  Date Value Ref Range Status  12/04/2017 38.1 36.0 - 46.0 % Final  07/14/2013 38.9 35.0 - 47.0 % Final   MCHC  Date Value Ref Range Status  12/04/2017 33.3 30.0 - 36.0 g/dL Final   [facility]  Date Value Ref Range Status  12/04/2017 31.1 26.0 - 34.0 pg Final   MCV  Date Value  Ref Range Status  12/04/2017 93.2 80.0 - 100.0 fL Final  07/14/2013 94 80 - 100 fL Final   No results found for: "PLTCOUNTKUC", "LABPLAT", "POCPLA" RDW  Date Value Ref Range Status  12/04/2017 13.1 11.5 - 15.5 % Final  07/14/2013 12.9 11.5 - 14.5 % Final         Failed - CMP within normal limits and completed in the last 12 months    Albumin  Date Value Ref Range Status  11/05/2020 4.8 3.8 - 4.8 g/dL Final  07/14/2013 4.4 3.4 - 5.0 g/dL Final   Alkaline Phosphatase  Date Value Ref Range Status  11/05/2020 56 44 - 121 IU/L Final  07/14/2013 38 (L) Unit/L Final    Comment:    45-117 NOTE: New Reference Range 12/10/12    ALT  Date Value Ref Range Status  12/04/2017 18 0 - 44 U/L Final   SGPT (ALT)  Date Value Ref Range Status  07/14/2013 14 12 - 78 U/L Final   AST  Date Value Ref Range Status  11/05/2020 21 0 - 40 IU/L Final   SGOT(AST)  Date Value Ref Range Status  07/14/2013 29 15 - 37 Unit/L Final   BUN  Date Value Ref Range Status  11/05/2020 10 6 - 20 mg/dL Final  07/14/2013 14 7 - 18 mg/dL Final   Calcium  Date Value Ref Range Status  11/05/2020 10.0 8.7 - 10.2 mg/dL Final   Calcium, Total  Date Value Ref Range Status  07/14/2013 8.8 8.5 - 10.1 mg/dL Final   CO2  Date Value Ref Range Status  12/04/2017 25 22 - 32 mmol/L Final   Co2  Date Value Ref Range Status  07/14/2013 26 21 -  32 mmol/L Final   Creatinine  Date Value Ref Range Status  07/14/2013 0.97 0.60 - 1.30 mg/dL Final   Creatinine, Ser  Date Value Ref Range Status  11/05/2020 0.78 0.57 - 1.00 mg/dL Final   Glucose  Date Value Ref Range Status  11/05/2020 130 (H) 70 - 99 mg/dL Final  07/14/2013 80 65 - 99 mg/dL Final   Glucose, Bld  Date Value Ref Range Status  12/04/2017 115 (H) 70 - 99 mg/dL Final   Glucose-Capillary  Date Value Ref Range Status  06/30/2014 80 65 - 99 mg/dL Final   Potassium  Date Value Ref Range Status  11/05/2020 4.5 3.5 - 5.2 mmol/L Final  07/14/2013 3.9 3.5 - 5.1 mmol/L Final   Sodium  Date Value Ref Range Status  11/05/2020 139 134 - 144 mmol/L Final  07/14/2013 137 136 - 145 mmol/L Final   Bilirubin,Total  Date Value Ref Range Status  07/14/2013 0.4 0.2 - 1.0 mg/dL Final   Bilirubin Total  Date Value Ref Range Status  11/05/2020 0.4 0.0 - 1.2 mg/dL Final   Protein, ur  Date Value Ref Range Status  07/15/2016 NEGATIVE NEGATIVE mg/dL Final   Total Protein  Date Value Ref Range Status  11/05/2020 7.4 6.0 - 8.5 g/dL Final  07/14/2013 7.3 6.4 - 8.2 g/dL Final   EGFR (African American)  Date Value Ref Range Status  07/14/2013 >60  Final   GFR calc Af Amer  Date Value Ref Range Status  12/04/2017 >60 >60 mL/min Final    Comment:    (NOTE) The eGFR has been calculated using the CKD EPI equation. This calculation has not been validated in all clinical situations. eGFR's persistently <60 mL/min signify possible Chronic Kidney Disease.    eGFR  Date Value Ref Range Status  11/05/2020 103 >59 mL/min/1.73 Final   EGFR (Non-African Amer.)  Date Value Ref Range Status  07/14/2013 >60  Final    Comment:    eGFR values <50mL/min/1.73 m2 may be an indication of chronic kidney disease (CKD). Calculated eGFR is useful in patients with stable renal function. The eGFR calculation will not be reliable in acutely ill patients when serum creatinine is  changing rapidly. It is not useful in  patients on dialysis. The eGFR calculation may not be applicable to patients at the low and high extremes of body sizes, pregnant women, and vegetarians.    GFR calc non Af Amer  Date Value Ref Range Status  12/04/2017 >60 >60 mL/min Final         Passed - Completed PHQ-2 or PHQ-9 in the last 360 days      Passed - Last BP in normal range    BP Readings from Last 1 Encounters:  01/23/22 125/85         Passed - Last Heart Rate in normal range    Pulse Readings from Last 1 Encounters:  01/23/22 96         Passed - Valid encounter within last 6 months    Recent Outpatient Visits           1 month ago Wheezing on auscultation   [place] M[place]um, Erin E, PA-C   2 months ago Borderline personality disorder [facility])   [place] Vincent, Santiago Glad, NP   4 months ago Severe episode of recurrent major depressive disorder, without psychotic features [facility])   [place] Jon Billings, NP   1 year ago Severe episode of recurrent major depressive disorder, without psychotic features ([place])   [place] Jon Billings, NP   1 year ago Chronic low back pain (1ry area of Pain) (Bilateral) (L>R) w/ sciatica (Bilateral)   [place] Jon Billings, NP       Future Appointments             In 2 weeks Jon Billings, NP [place], [facility]             gabapentin (NEURONTIN) 400 MG capsule 270 capsule 0    Sig: Take 1 capsule (400 mg total) by mouth 3 (three) times daily.     Neurology: Anticonvulsants - gabapentin Failed - 02/25/2022  2:58 PM      Failed - Cr in normal range and within 360 days    Creatinine  Date Value Ref Range Status  07/14/2013 0.97 0.60 - 1.30 mg/dL Final   Creatinine, Ser  Date Value Ref Range Status  11/05/2020 0.78 0.57 - 1.00 mg/dL Final         Passed  - Completed PHQ-2 or PHQ-9 in the last 360 days      Passed - Valid encounter within last 12 months    Recent Outpatient Visits           1 month ago Wheezing on auscultation   [place], PA-C   2 months ago Borderline personality disorder [facility])   [place] Jon Billings, NP   4 months ago Severe episode of recurrent major depressive disorder, without psychotic features [facility])   [place], NP   1 year ago Severe episode of recurrent major depressive  disorder, without psychotic features [facility])   [place] Jon Billings, NP   1 year ago Chronic low back pain (1ry area of Pain) (Bilateral) (L>R) w/ sciatica (Bilateral)   [place] Jon Billings, NP       Future Appointments             In 2 weeks Jon Billings, NP [place], [place]

## 2022-02-26 NOTE — Telephone Encounter (Signed)
Pt called and states that the dates do not add up, she says that she is due for a refill because she received her last supply 11/04/2021. States she is completely out of her current supply and that she needs a refill.

## 2022-02-26 NOTE — Telephone Encounter (Signed)
Called and confirmed with [facility] that they have both prescriptions and are getting them ready for the patient. Prescriptions have been on file because patient did not pick up refills when they were sent in.    Called and LVM notifying patient that prescriptions were at the pharmacy and getting filled for her now.

## 2022-02-26 NOTE — Telephone Encounter (Signed)
Pt aware that it is too early to refill medication. She states that she does not know how she is out early as she only takes one a night. Pt verbalized understanding that provider is unable to refill medication because it is too early. She is due for a refill on the day of her next appointment which is 03/18/2022.

## 2022-02-26 NOTE — Telephone Encounter (Signed)
Pt called to see if refills can be filled today / please advise

## 2022-03-17 NOTE — Progress Notes (Deleted)
There were no vitals taken for this visit.   Subjective:    Patient ID: Kathy Henry, female    DOB: August 09, 1990, 32 y.o.   MRN: [id number]  HPI: Kathy Henry is a 32 y.o. female presenting on 03/18/2022 for comprehensive medical examination. Current medical complaints include:{Blank single:19197::"none","***"}  She currently lives with: Menopausal Symptoms: {Blank single:19197::"yes","no"}  DEPRESSION/ANXIETY Patient states she has not seen a psychiatrist yet.  The Seroquel is still helping her sleep at night. She is aware that her symptoms are not well controlled.  Is okay if we make the appt for her and let her know when it is.  Does not have thoughts of harming herself but does have thoughts that she would be better off dead.  No plans to commit suicide.  [facility] Visit from 12/16/2021 in [place]  PHQ-9 Total Score 20         12/16/2021   10:35 AM 10/15/2021   11:00 AM 02/21/2021    2:18 PM 11/21/2020    2:59 PM  GAD 7 : Generalized Anxiety Score  Nervous, Anxious, on Edge '2 3 2 3  '$ Control/stop worrying '2 3 2 3  '$ Worry too much - different things '2 3 2 3  '$ Trouble relaxing '2 3 3 3  '$ Restless '2 3 2 3  '$ Easily annoyed or irritable '2 2 2 3  '$ Afraid - awful might happen '2 2 2 3  '$ Total GAD 7 Score '14 19 15 21  '$ Anxiety Difficulty Very difficult Not difficult at all Very difficult Extremely difficult     BACK PAIN Patient states her back has been hurting.  She was seen in the ER on 11/22.  She was given Toradol injection which really helped her pain.  Patient is still taking Gabapentin and Methocarbamol.  She was having trouble being stable on her legs when the pain was happening. She does have a bulging disc in her lower back.  Patient states she didn't get the referral to pain management from her spine doctor.  Functional Status Survey:       12/16/2021   10:34 AM 10/15/2021   11:00 AM 02/21/2021    2:17 PM 11/05/2020    1:20 PM 06/04/2020    9:46  AM  Fall Risk   Falls in the past year? '1 1 1 '$ 0 0  Number falls in past yr: 1 1 0  0  Injury with Fall? 0 0 1  0  Risk for fall due to : History of fall(s) History of fall(s) Impaired mobility;Impaired balance/gait  Impaired balance/gait  Follow up Falls evaluation completed Falls evaluation completed Falls evaluation completed  Falls evaluation completed    Depression Screen    12/16/2021   10:34 AM 10/15/2021   11:00 AM 02/21/2021    2:17 PM 11/21/2020    2:59 PM 07/05/2020   10:54 AM  Depression screen PHQ 2/9  Decreased Interest '2 2 2 3 2  '$ Down, Depressed, Hopeless '2 2 1 3 3  '$ PHQ - 2 Score '4 4 3 6 5  '$ Altered sleeping '1 2 3 3 '$ 0  Tired, decreased energy '2 3 3 3 2  '$ Change in appetite '3 3 2 3 2  '$ Feeling bad or failure about yourself  '2 2 1 3 3  '$ Trouble concentrating '3 3 3 3 3  '$ Moving slowly or fidgety/restless '2 3 2 3 1  '$ Suicidal thoughts '3 2 1 3 2  '$ PHQ-9 Score '20 22 18 27 '$ 18  Difficult doing work/chores Very difficult Very difficult Very difficult Extremely dIfficult Extremely dIfficult     Advanced Directives Does patient have a HCPOA?    {Blank single:19197::"yes","no"} If yes, name and contact information:  Does patient have a living will or MOST form?  {Blank single:19197::"yes","no"}  Past Medical History:  Past Medical History:  Diagnosis Date   Anxiety    Bipolar 1 disorder ([place])    Depression    OCD (obsessive compulsive disorder)     Surgical History:  Past Surgical History:  Procedure Laterality Date   FRACTURE SURGERY     SPINE SURGERY      Medications:  Current Outpatient Medications on File Prior to Visit  Medication Sig   acetaminophen (TYLENOL) 500 MG tablet Take 500 mg by mouth every 6 (six) hours as needed.   cyclobenzaprine (FLEXERIL) 10 MG tablet Take 1 tablet (10 mg total) by mouth 3 (three) times daily as needed.   gabapentin (NEURONTIN) 400 MG capsule Take 1 capsule (400 mg total) by mouth 3 (three) times daily.    promethazine-dextromethorphan (PROMETHAZINE-DM) 6.25-15 MG/5ML syrup Take 5 mLs by mouth 4 (four) times daily as needed for cough.   QUEtiapine (SEROQUEL) 25 MG tablet Take 1 tablet (25 mg total) by mouth at bedtime.   No current facility-administered medications on file prior to visit.    Allergies:  No Known Allergies  Social History:  Social History   Socioeconomic History   Marital status: Single    Spouse name: Not on file   Number of children: Not on file   Years of education: Not on file   Highest education level: Not on file  Occupational History   Not on file  Tobacco Use   Smoking status: Every Day    Packs/day: 1.00    Years: 5.00    Total pack years: 5.00    Types: Cigarettes    Last attempt to quit: 07/18/2015    Years since quitting: 6.6   Smokeless tobacco: Never  Vaping Use   Vaping Use: Never used  Substance and Sexual Activity   Alcohol use: Yes    Alcohol/week: 3.0 standard drinks of alcohol    Types: 3 Shots of liquor per week   Drug use: Yes    Types: Marijuana   Sexual activity: Yes    Birth control/protection: None  Other Topics Concern   Not on file  Social History Narrative   Not on file   Social Determinants of Health   Financial Resource Strain: Not on file  Food Insecurity: Not on file  Transportation Needs: Not on file  Physical Activity: Not on file  Stress: Not on file  Social Connections: Not on file  Intimate Partner Violence: Not on file   Social History   Tobacco Use  Smoking Status Every Day   Packs/day: 1.00   Years: 5.00   Total pack years: 5.00   Types: Cigarettes   Last attempt to quit: 07/18/2015   Years since quitting: 6.6  Smokeless Tobacco Never   Social History   Substance and Sexual Activity  Alcohol Use Yes   Alcohol/week: 3.0 standard drinks of alcohol   Types: 3 Shots of liquor per week    Family History:  Family History  Problem Relation Age of Onset   Ovarian cancer Mother    COPD Father     Heart failure Father     Past medical history, surgical history, medications, allergies, family history and social history reviewed with patient today  and changes made to appropriate areas of the chart.   ROS  All other ROS negative except what is listed above and in the HPI.      Objective:    There were no vitals taken for this visit.  Wt Readings from Last 3 Encounters:  01/23/22 140 lb 12.8 oz (63.9 kg)  12/16/21 137 lb 12.8 oz (62.5 kg)  12/11/21 136 lb 11 oz (62 kg)    No results found.  Physical Exam      No data to display          Cognitive Testing - 6-CIT  Correct? Score   What year is it? {YES NO:22349} {Numbers; 0-4:31231} Yes = 0    No = 4  What month is it? {YES NO:22349} {Numbers; 0-4:31231} Yes = 0    No = 3  Remember:     Pia [address]au, [place], [place]     What time is it? {YES NO:22349} {Numbers; 0-4:31231} Yes = 0    No = 3  Count backwards from 20 to 1 {YES NO:22349} {Numbers; 0-4:31231} Correct = 0    1 error = 2   More than 1 error = 4  Say the months of the year in reverse. {YES NO:22349} {Numbers; 0-4:31231} Correct = 0    1 error = 2   More than 1 error = 4  What address did I ask you to remember? {YES NO:22349} {NUMBERS; 0-10:5044} Correct = 0  1 error = 2    2 error = 4    3 error = 6    4 error = 8    All wrong = 10       TOTAL SCORE  {Numbers; [id number]   Interpretation:  {Desc; normal/abnormal:11317::"Normal"}  Normal (0-7) Abnormal (8-28)   Results for orders placed or performed in visit on 11/21/20  HgB A1c  Result Value Ref Range   Hgb A1c MFr Bld 5.2 4.8 - 5.6 %   Est. average glucose Bld gHb Est-mCnc 103 mg/dL      Assessment & Plan:   Problem List Items Addressed This Visit   None    Preventative Services:  AAA screening:  Health Risk Assessment and Personalized Prevention Plan: Bone Mass Measurements: Breast Cancer Screening: CVD Screening:  Cervical Cancer Screening: Colon Cancer Screening:  Depression  Screening:  Diabetes Screening:  Glaucoma Screening:  Hepatitis B vaccine: Hepatitis C screening:  HIV Screening: Flu Vaccine: Lung cancer Screening: Obesity Screening:  Pneumonia Vaccines (2): STI Screening:  Follow up plan: No follow-ups on file.   LABORATORY TESTING:  - Pap smear: {Blank [id number] done","not applicable","up to date","done elsewhere"}  IMMUNIZATIONS:   - Tdap: Tetanus vaccination status reviewed: {tetanus status:315746}. - Influenza: {Blank single:19197::"Up to date","Administered today","Postponed to flu season","Refused","Given elsewhere"} - Pneumovax: {Blank single:19197::"Up to date","Administered today","Not applicable","Refused","Given elsewhere"} - Prevnar: {Blank single:19197::"Up to date","Administered today","Not applicable","Refused","Given elsewhere"} - Zostavax vaccine: {Blank single:19197::"Up to date","Administered today","Not applicable","Refused","Given elsewhere"}  SCREENING: -Mammogram: {Blank single:19197::"Up to date","Ordered today","Not applicable","Refused","Done elsewhere"}  - Colonoscopy: {Blank single:19197::"Up to date","Ordered today","Not applicable","Refused","Done elsewhere"}  - Bone Density: {Blank single:19197::"Up to date","Ordered today","Not applicable","Refused","Done elsewhere"}  -Hearing Test: {Blank single:19197::"Up to date","Ordered today","Not applicable","Refused","Done elsewhere"}  -Spirometry: {Blank single:19197::"Up to date","Ordered today","Not applicable","Refused","Done elsewhere"}   PATIENT COUNSELING:   Advised to take 1 mg of folate supplement per day if capable of pregnancy.   Sexuality: Discussed sexually transmitted diseases, partner selection, use of condoms, avoidance of unintended pregnancy  and contraceptive alternatives.  Advised to avoid cigarette smoking.  I discussed with the patient that most people either abstain from alcohol or drink within safe limits (<=14/week and <=4  drinks/occasion for males, <=7/weeks and <= 3 drinks/occasion for females) and that the risk for alcohol disorders and other health effects rises proportionally with the number of drinks per week and how often a drinker exceeds daily limits.  Discussed cessation/primary prevention of drug use and availability of treatment for abuse.   Diet: Encouraged to adjust caloric intake to maintain  or achieve ideal body weight, to reduce intake of dietary saturated fat and total fat, to limit sodium intake by avoiding high sodium foods and not adding table salt, and to maintain adequate dietary potassium and calcium preferably from fresh fruits, vegetables, and low-fat dairy products.    stressed the importance of regular exercise  Injury prevention: Discussed safety belts, safety helmets, smoke detector, smoking near bedding or upholstery.   Dental health: Discussed importance of regular tooth brushing, flossing, and dental visits.    NEXT PREVENTATIVE PHYSICAL DUE IN 1 YEAR. No follow-ups on file.

## 2022-03-18 ENCOUNTER — Ambulatory Visit: Payer: Medicare Other | Admitting: Nurse Practitioner

## 2022-03-27 ENCOUNTER — Encounter: Payer: Self-pay | Admitting: Nurse Practitioner

## 2022-03-27 ENCOUNTER — Ambulatory Visit (INDEPENDENT_AMBULATORY_CARE_PROVIDER_SITE_OTHER): Payer: Medicare Other | Admitting: Nurse Practitioner

## 2022-03-27 VITALS — BP 139/83 | HR 73 | Temp 97.8°F | Wt 135.2 lb

## 2022-03-27 DIAGNOSIS — F341 Dysthymic disorder: Secondary | ICD-10-CM | POA: Diagnosis not present

## 2022-03-27 DIAGNOSIS — E559 Vitamin D deficiency, unspecified: Secondary | ICD-10-CM

## 2022-03-27 DIAGNOSIS — Z720 Tobacco use: Secondary | ICD-10-CM

## 2022-03-27 DIAGNOSIS — Z0000 Encounter for general adult medical examination without abnormal findings: Secondary | ICD-10-CM | POA: Diagnosis not present

## 2022-03-27 DIAGNOSIS — F603 Borderline personality disorder: Secondary | ICD-10-CM | POA: Diagnosis not present

## 2022-03-27 DIAGNOSIS — Z23 Encounter for immunization: Secondary | ICD-10-CM | POA: Diagnosis not present

## 2022-03-27 DIAGNOSIS — Z1159 Encounter for screening for other viral diseases: Secondary | ICD-10-CM

## 2022-03-27 DIAGNOSIS — Z114 Encounter for screening for human immunodeficiency virus [HIV]: Secondary | ICD-10-CM

## 2022-03-27 DIAGNOSIS — M546 Pain in thoracic spine: Secondary | ICD-10-CM

## 2022-03-27 DIAGNOSIS — Z136 Encounter for screening for cardiovascular disorders: Secondary | ICD-10-CM | POA: Diagnosis not present

## 2022-03-27 DIAGNOSIS — Z7189 Other specified counseling: Secondary | ICD-10-CM

## 2022-03-27 MED ORDER — GABAPENTIN 400 MG PO CAPS: 400.0000 mg | ORAL_CAPSULE | Freq: Three times a day (TID) | ORAL | 1 refills | Status: DC

## 2022-03-27 MED ORDER — QUETIAPINE FUMARATE 25 MG PO TABS: 25.0000 mg | ORAL_TABLET | Freq: Every day | ORAL | 1 refills | Status: DC

## 2022-03-27 MED ORDER — CYCLOBENZAPRINE HCL 10 MG PO TABS: 10.0000 mg | ORAL_TABLET | Freq: Three times a day (TID) | ORAL | 1 refills | Status: DC | PRN

## 2022-03-27 MED ORDER — KETOROLAC TROMETHAMINE 60 MG/2ML IM SOLN
60.0000 mg | Freq: Once | INTRAMUSCULAR | Status: AC
  Administered 2022-03-27: 60 mg via INTRAMUSCULAR

## 2022-03-27 NOTE — Assessment & Plan Note (Signed)
Labs ordered at visit today.  Will make recommendations based on lab results.   

## 2022-03-27 NOTE — Assessment & Plan Note (Signed)
Doing okay with Seroquel. Depression and Anxiety are improved.  Patient has not seen psychiatry.  She would like a new referral now that she has insurance.  Denies SI. Follow up in 3 months.  Call sooner if concerns arise.

## 2022-03-27 NOTE — Assessment & Plan Note (Signed)
A voluntary discussion about advance care planning including the explanation and discussion of advance directives was extensively discussed  with the patient for 5 minutes with patient.  Explanation about the health care proxy and Living will was reviewed and packet with forms with explanation of how to fill them out was given.   Patient was offered a separate [place] visit for further assistance with forms.

## 2022-03-27 NOTE — Assessment & Plan Note (Signed)
Working with [facility].  Will see their spine specialist. Would like Toradol injection today.  Refilled Gabapentin and Flexeril.  Follow up in 3 months.  Call sooner if concerns arise.

## 2022-03-27 NOTE — Progress Notes (Signed)
Results discussed with patient during visit.

## 2022-03-27 NOTE — Progress Notes (Signed)
BP 139/83   Pulse 73   Temp 97.8 F (36.6 C) (Oral)   Wt 135 lb 3.2 oz (61.3 kg)   SpO2 98%   BMI 20.56 kg/m    Subjective:    Patient ID: Kathy Henry, female    DOB: [birth year], 32 y.o.   MRN: [id number]  HPI: Kathy Henry is a 32 y.o. female presenting on 03/27/2022 for comprehensive medical examination. Current medical complaints include: pain  She currently lives with: Menopausal Symptoms: no  DEPRESSION/ANXIETY Patient states she has not seen a psychiatrist yet.  She is working to find a Transport planner.  She now has insurance and would like to see what her options are.    [facility] Visit from 12/16/2021 in [facility]  PHQ-9 Total Score 20         12/16/2021   10:35 AM 10/15/2021   11:00 AM 02/21/2021    2:18 PM 11/21/2020    2:59 PM  GAD 7 : Generalized Anxiety Score  Nervous, Anxious, on Edge '2 3 2 3  '$ Control/stop worrying '2 3 2 3  '$ Worry too much - different things '2 3 2 3  '$ Trouble relaxing '2 3 3 3  '$ Restless '2 3 2 3  '$ Easily annoyed or irritable '2 2 2 3  '$ Afraid - awful might happen '2 2 2 3  '$ Total GAD 7 Score '14 19 15 21  '$ Anxiety Difficulty Very difficult Not difficult at all Very difficult Extremely difficult     BACK PAIN Patient states her back has been hurting.  Her hips are bothering her.  She is seeing the spine doctor at [facility].  She is open to surgery.  She wakes up at night with the pain.  She is still taking the Gabapentin and the flexeril.  The toradol helped her at our last visit for about 24 hours.    Functional Status Survey:       03/27/2022    2:14 PM 12/16/2021   10:34 AM 10/15/2021   11:00 AM 02/21/2021    2:17 PM 11/05/2020    1:20 PM  Fall Risk   Falls in the past year? '1 1 1 1 '$ 0  Number falls in past yr: 0 1 1 0   Injury with Fall? 0 0 0 1   Risk for fall due to : No Fall Risks History of fall(s) History of fall(s) Impaired mobility;Impaired balance/gait   Follow up Falls evaluation completed Falls  evaluation completed Falls evaluation completed Falls evaluation completed     Depression Screen    03/27/2022    2:14 PM 12/16/2021   10:34 AM 10/15/2021   11:00 AM 02/21/2021    2:17 PM 11/21/2020    2:59 PM  Depression screen PHQ 2/9  Decreased Interest '2 2 2 2 3  '$ Down, Depressed, Hopeless '2 2 2 1 3  '$ PHQ - 2 Score '4 4 4 3 6  '$ Altered sleeping '1 1 2 3 3  '$ Tired, decreased energy '2 2 3 3 3  '$ Change in appetite '3 3 3 2 3  '$ Feeling bad or failure about yourself  '2 2 2 1 3  '$ Trouble concentrating '3 3 3 3 3  '$ Moving slowly or fidgety/restless '2 2 3 2 3  '$ Suicidal thoughts '2 3 2 1 3  '$ PHQ-9 Score '19 20 22 18 27  '$ Difficult doing work/chores Somewhat difficult Very difficult Very difficult Very difficult Extremely dIfficult     Advanced Directives Does patient have a  HCPOA?    no If yes, name and contact information:  Does patient have a living will or MOST form?  no  Past Medical History:  Past Medical History:  Diagnosis Date   Anxiety    Bipolar 1 disorder ([place])    Depression    OCD (obsessive compulsive disorder)     Surgical History:  Past Surgical History:  Procedure Laterality Date   FRACTURE SURGERY     SPINE SURGERY      Medications:  Current Outpatient Medications on File Prior to Visit  Medication Sig   acetaminophen (TYLENOL) 500 MG tablet Take 500 mg by mouth every 6 (six) hours as needed.   No current facility-administered medications on file prior to visit.    Allergies:  No Known Allergies  Social History:  Social History   Socioeconomic History   Marital status: Single    Spouse name: Not on file   Number of children: Not on file   Years of education: Not on file   Highest education level: Not on file  Occupational History   Not on file  Tobacco Use   Smoking status: Every Day    Packs/day: 0.50    Years: 5.00    Total pack years: 2.50    Types: Cigarettes    Last attempt to quit: 07/18/2015    Years since quitting: 6.6   Smokeless tobacco:  Never  Vaping Use   Vaping Use: Never used  Substance and Sexual Activity   Alcohol use: Yes    Alcohol/week: 3.0 standard drinks of alcohol    Types: 3 Shots of liquor per week   Drug use: Yes    Types: Marijuana   Sexual activity: Yes    Birth control/protection: None  Other Topics Concern   Not on file  Social History Narrative   Not on file   Social Determinants of Health   Financial Resource Strain: Not on file  Food Insecurity: Not on file  Transportation Needs: Not on file  Physical Activity: Not on file  Stress: Not on file  Social Connections: Not on file  Intimate Partner Violence: Not on file   Social History   Tobacco Use  Smoking Status Every Day   Packs/day: 0.50   Years: 5.00   Total pack years: 2.50   Types: Cigarettes   Last attempt to quit: 07/18/2015   Years since quitting: 6.6  Smokeless Tobacco Never   Social History   Substance and Sexual Activity  Alcohol Use Yes   Alcohol/week: 3.0 standard drinks of alcohol   Types: 3 Shots of liquor per week    Family History:  Family History  Problem Relation Age of Onset   Ovarian cancer Mother    COPD Father    Heart failure Father     Past medical history, surgical history, medications, allergies, family history and social history reviewed with patient today and changes made to appropriate areas of the chart.   Review of Systems  Musculoskeletal:  Positive for back pain.  Psychiatric/Behavioral:  Positive for depression. Negative for suicidal ideas. The patient is nervous/anxious.     All other ROS negative except what is listed above and in the HPI.      Objective:    BP 139/83   Pulse 73   Temp 97.8 F (36.6 C) (Oral)   Wt 135 lb 3.2 oz (61.3 kg)   SpO2 98%   BMI 20.56 kg/m   Wt Readings from Last 3 Encounters:  03/27/22  135 lb 3.2 oz (61.3 kg)  01/23/22 140 lb 12.8 oz (63.9 kg)  12/16/21 137 lb 12.8 oz (62.5 kg)    Hearing Screening   '500Hz'$  '1000Hz'$  '2000Hz'$  '4000Hz'$   Right ear  '20 20 20 20  '$ Left ear 40 40 25 40   Vision Screening   Right eye Left eye Both eyes  Without correction '20/50 20/50 20/50 '$  With correction       Physical Exam Vitals and nursing note reviewed.  Constitutional:      General: She is not in acute distress.    Appearance: Normal appearance. She is normal weight. She is not ill-appearing, toxic-appearing or diaphoretic.  HENT:     Head: Normocephalic.     Right Ear: External ear normal.     Left Ear: External ear normal.     Nose: Nose normal.     Mouth/Throat:     Mouth: Mucous membranes are moist.     Pharynx: Oropharynx is clear.  Eyes:     General:        Right eye: No discharge.        Left eye: No discharge.     Extraocular Movements: Extraocular movements intact.     Conjunctiva/sclera: Conjunctivae normal.     Pupils: Pupils are equal, round, and reactive to light.  Cardiovascular:     Rate and Rhythm: Normal rate and regular rhythm.     Heart sounds: No murmur heard. Pulmonary:     Effort: Pulmonary effort is normal. No respiratory distress.     Breath sounds: Normal breath sounds. No wheezing or rales.  Musculoskeletal:     Cervical back: Normal range of motion and neck supple.     Comments: Walks with cane  Skin:    General: Skin is warm and dry.     Capillary Refill: Capillary refill takes less than 2 seconds.  Neurological:     General: No focal deficit present.     Mental Status: She is alert and oriented to person, place, and time. Mental status is at baseline.  Psychiatric:        Mood and Affect: Mood normal.        Behavior: Behavior normal.        Thought Content: Thought content normal.        Judgment: Judgment normal.         No data to display          Cognitive Testing - 6-CIT  Correct? Score   What year is it? yes 0 Yes = 0    No = 4  What month is it? yes 0 Yes = 0    No = 3  Remember:     Pia [place], [place], [place]     What time is it? yes 0 Yes = 0    No = 3  Count  backwards from 20 to 1 yes 0 Correct = 0    1 error = 2   More than 1 error = 4  Say the months of the year in reverse. yes 0 Correct = 0    1 error = 2   More than 1 error = 4  What address did I ask you to remember? no 2 Correct = 0  1 error = 2    2 error = 4    3 error = 6    4 error = 8    All wrong = 10  TOTAL SCORE  2/28   Interpretation:  Normal  Normal (0-7) Abnormal (8-28)   Results for orders placed or performed in visit on 11/21/20  HgB A1c  Result Value Ref Range   Hgb A1c MFr Bld 5.2 4.8 - 5.6 %   Est. average glucose Bld gHb Est-mCnc 103 mg/dL      Assessment & Plan:   Problem List Items Addressed This Visit       Other   Depression (Chronic)    Doing okay with Seroquel. Depression and Anxiety are improved.  Patient has not seen psychiatry.  She would like a new referral now that she has insurance.  Denies SI. Follow up in 3 months.  Call sooner if concerns arise.       Borderline personality disorder ([place])    Doing okay with Seroquel. Depression and Anxiety are improved.  Patient has not seen psychiatry.  She would like a new referral now that she has insurance.  Denies SI. Follow up in 3 months.  Call sooner if concerns arise.       Relevant Orders   Comp Met (CMET)   Ambulatory referral to Psychiatry   Thoracic spine pain    Working with [facility].  Will see their spine specialist. Would like Toradol injection today.  Refilled Gabapentin and Flexeril.  Follow up in 3 months.  Call sooner if concerns arise.       Relevant Medications   ketorolac (TORADOL) injection 60 mg   gabapentin (NEURONTIN) 400 MG capsule   cyclobenzaprine (FLEXERIL) 10 MG tablet   Vitamin D deficiency    Labs ordered at visit today.  Will make recommendations based on lab results.        Relevant Orders   Vitamin D (25 hydroxy)   Advanced care planning/counseling discussion    A voluntary discussion about advance care planning including the explanation and discussion of  advance directives was extensively discussed  with the patient for 5 minutes with patient.  Explanation about the health care proxy and Living will was reviewed and packet with forms with explanation of how to fill them out was given.   Patient was offered a separate [place] visit for further assistance with forms.         Other Visit Diagnoses     Welcome to Medicare preventive visit    -  Primary   EKG showed NSR.   Relevant Orders   EKG 12-Lead (Completed)   Screening for ischemic heart disease       Relevant Orders   Lipid Profile   Screening for HIV (human immunodeficiency virus)       Relevant Orders   HIV Antibody (routine testing w rflx)   Encounter for hepatitis C screening test for low risk patient       Relevant Orders   Hepatitis C Antibody   Need for pneumococcal 20-valent conjugate vaccination       Relevant Orders   Pneumococcal conjugate vaccine 20-valent (Prevnar 20)   Tobacco use       Prevnar 20 given at visit today.   Relevant Medications   ketorolac (TORADOL) injection 60 mg        Preventative Services:  AAA screening: NA Health Risk Assessment and Personalized Prevention Plan: NA Bone Mass Measurements: NA Breast Cancer Screening: NA CVD Screening: NA Cervical Cancer Screening: Needs to be updated Colon Cancer Screening: NA Depression Screening: Up to date Diabetes Screening: Up to date Glaucoma Screening: NA Hepatitis B  vaccine: NA Hepatitis C screening: Done today HIV Screening: Done today  Flu Vaccine: Up to date Lung cancer Screening: NA Obesity Screening: Up to date Pneumonia Vaccines (2): Up to date STI Screening: NA  Follow up plan: Return in about 3 months (around 06/27/2022) for HTN, HLD, DM2 FU.   LABORATORY TESTING:  - Pap smear:  not up to date  IMMUNIZATIONS:   - Tdap: Tetanus vaccination status reviewed: last tetanus booster within 10 years. - Influenza:  Up to date - Pneumovax: Not applicable - Prevnar:  Administered today - Zostavax vaccine: Not applicable  SCREENING: -Mammogram: Not applicable  - Colonoscopy: Not applicable  - Bone Density: Not applicable  -Hearing Test: Not applicable  -Spirometry: Not applicable   PATIENT COUNSELING:   Advised to take 1 mg of folate supplement per day if capable of pregnancy.   Sexuality: Discussed sexually transmitted diseases, partner selection, use of condoms, avoidance of unintended pregnancy  and contraceptive alternatives.   Advised to avoid cigarette smoking.  I discussed with the patient that most people either abstain from alcohol or drink within safe limits (<=14/week and <=4 drinks/occasion for males, <=7/weeks and <= 3 drinks/occasion for females) and that the risk for alcohol disorders and other health effects rises proportionally with the number of drinks per week and how often a drinker exceeds daily limits.  Discussed cessation/primary prevention of drug use and availability of treatment for abuse.   Diet: Encouraged to adjust caloric intake to maintain  or achieve ideal body weight, to reduce intake of dietary saturated fat and total fat, to limit sodium intake by avoiding high sodium foods and not adding table salt, and to maintain adequate dietary potassium and calcium preferably from fresh fruits, vegetables, and low-fat dairy products.    stressed the importance of regular exercise  Injury prevention: Discussed safety belts, safety helmets, smoke detector, smoking near bedding or upholstery.   Dental health: Discussed importance of regular tooth brushing, flossing, and dental visits.    NEXT PREVENTATIVE PHYSICAL DUE IN 1 YEAR. Return in about 3 months (around 06/27/2022) for HTN, HLD, DM2 FU.

## 2022-03-28 LAB — COMPREHENSIVE METABOLIC PANEL
ALT: 14 IU/L (ref 0–32)
AST: 21 IU/L (ref 0–40)
Albumin/Globulin Ratio: 2.1 (ref 1.2–2.2)
Albumin: 4.9 g/dL (ref 3.9–4.9)
Alkaline Phosphatase: 74 IU/L (ref 44–121)
BUN/Creatinine Ratio: 10 (ref 9–23)
BUN: 6 mg/dL (ref 6–20)
Bilirubin Total: 0.2 mg/dL (ref 0.0–1.2)
CO2: 22 mmol/L (ref 20–29)
Calcium: 9.9 mg/dL (ref 8.7–10.2)
Chloride: 101 mmol/L (ref 96–106)
Creatinine, Ser: 0.63 mg/dL (ref 0.57–1.00)
Globulin, Total: 2.3 g/dL (ref 1.5–4.5)
Glucose: 85 mg/dL (ref 70–99)
Potassium: 3.8 mmol/L (ref 3.5–5.2)
Sodium: 139 mmol/L (ref 134–144)
Total Protein: 7.2 g/dL (ref 6.0–8.5)
eGFR: 120 mL/min/{1.73_m2} (ref >59)

## 2022-03-28 LAB — HEPATITIS C ANTIBODY: Hep C Virus Ab: NONREACTIVE

## 2022-03-28 LAB — LIPID PANEL
Chol/HDL Ratio: 2.7 ratio (ref 0.0–4.4)
Cholesterol, Total: 176 mg/dL (ref 100–199)
HDL: 65 mg/dL (ref >39)
LDL Chol Calc (NIH): 94 mg/dL (ref 0–99)
Triglycerides: 92 mg/dL (ref 0–149)
VLDL Cholesterol Cal: 17 mg/dL (ref 5–40)

## 2022-03-28 LAB — VITAMIN D 25 HYDROXY (VIT D DEFICIENCY, FRACTURES): Vit D, 25-Hydroxy: 6.6 ng/mL — ABNORMAL LOW (ref 30.0–100.0)

## 2022-03-28 LAB — HIV ANTIBODY (ROUTINE TESTING W REFLEX): HIV Screen 4th Generation wRfx: NONREACTIVE

## 2022-03-28 MED ORDER — VITAMIN D (ERGOCALCIFEROL) 1.25 MG (50000 UNIT) PO CAPS: 50000.0000 [IU] | ORAL_CAPSULE | ORAL | 0 refills | Status: AC

## 2022-03-28 NOTE — Addendum Note (Signed)
Addended by: Jon Billings on: 03/28/2022 07:54 AM   Modules accepted: Orders

## 2022-03-28 NOTE — Progress Notes (Signed)
Please let patient know that her vitamin D is very low.  I have sent in a prescription for Vitamin D 50,000 IU twice weekly for 12 weeks.  After that she should change to 3,000 IU once daily.  We will recheck her levels at our next visit.  Other lab work looks good.  No other concerns at this time.

## 2022-05-16 ENCOUNTER — Other Ambulatory Visit: Payer: Self-pay | Admitting: Nurse Practitioner

## 2022-05-16 DIAGNOSIS — M546 Pain in thoracic spine: Secondary | ICD-10-CM

## 2022-05-16 NOTE — Telephone Encounter (Signed)
Requested medications are due for refill today.  Unsure  Requested medications are on the active medications list.  Yes all 3  Last refill. All 3 refilled 03/27/2022 with 6 month supply  Future visit scheduled.   yes  Notes to clinic.  2 medications are not delegated.  Gabapentin refill is too soon.    Requested Prescriptions  Pending Prescriptions Disp Refills   cyclobenzaprine (FLEXERIL) 10 MG tablet 270 tablet 1    Sig: Take 1 tablet (10 mg total) by mouth 3 (three) times daily as needed.     Not Delegated - Analgesics:  Muscle Relaxants Failed - 05/16/2022  4:10 PM      Failed - This refill cannot be delegated      Passed - Valid encounter within last 6 months    Recent Outpatient Visits           1 month ago Welcome to [employer] preventive visit   [place] [facility] Larae Grooms, NP   3 months ago Wheezing on auscultation   [place] [facility] Mecum, Erin E, PA-C   5 months ago Borderline personality disorder [facility])   [place] [facility] Larae Grooms, NP   7 months ago Severe episode of recurrent major depressive disorder, without psychotic features [facility])   [place] [facility] Larae Grooms, NP   1 year ago Severe episode of recurrent major depressive disorder, without psychotic features [facility])   [place] [facility] Larae Grooms, NP       Future Appointments             In 1 month Larae Grooms, NP [place] [facility]             gabapentin (NEURONTIN) 400 MG capsule 270 capsule 1    Sig: Take 1 capsule (400 mg total) by mouth 3 (three) times daily.     Neurology: Anticonvulsants - gabapentin Passed - 05/16/2022  4:10 PM      Passed - Cr in normal range and within 360 days    Creatinine  Date Value Ref Range Status  07/14/2013 0.97 0.60 - 1.30 mg/dL Final   Creatinine, Ser  Date Value Ref Range Status  03/27/2022 0.63 0.57 - 1.00  mg/dL Final         Passed - Completed PHQ-2 or PHQ-9 in the last 360 days      Passed - Valid encounter within last 12 months    Recent Outpatient Visits           1 month ago Welcome to [employer] preventive visit   [place] [facility] Larae Grooms, NP   3 months ago Wheezing on auscultation   [place] [facility] Mecum, Erin E, PA-C   5 months ago Borderline personality disorder [facility])   [place] [facility] Larae Grooms, NP   7 months ago Severe episode of recurrent major depressive disorder, without psychotic features [facility])   [place] [facility] Larae Grooms, NP   1 year ago Severe episode of recurrent major depressive disorder, without psychotic features (HCC)   [place] [facility] Larae Grooms, NP       Future Appointments             In 1 month Larae Grooms, NP  [facility]             QUEtiapine (SEROQUEL) 25 MG tablet 90  tablet 1    Sig: Take 1 tablet (25 mg total) by mouth at bedtime.     Not Delegated - Psychiatry:  Antipsychotics - Second Generation (Atypical) - quetiapine Failed - 05/16/2022  4:10 PM      Failed - This refill cannot be delegated      Failed - TSH in normal range and within 360 days    TSH  Date Value Ref Range Status  08/17/2015 1.824 0.350 - 4.500 uIU/mL Final         Failed - Lipid Panel in normal range within the last 12 months    Cholesterol, Total  Date Value Ref Range Status  03/27/2022 176 100 - 199 mg/dL Final   LDL Chol Calc (NIH)  Date Value Ref Range Status  03/27/2022 94 0 - 99 mg/dL Final   HDL  Date Value Ref Range Status  03/27/2022 65 >39 mg/dL Final   Triglycerides  Date Value Ref Range Status  03/27/2022 92 0 - 149 mg/dL Final         Failed - CBC within normal limits and completed in the last 12 months    WBC  Date Value Ref Range Status  12/04/2017 12.2 (H) 4.0 - 10.5  K/uL Final   RBC  Date Value Ref Range Status  12/04/2017 4.09 3.87 - 5.11 MIL/uL Final   Hemoglobin  Date Value Ref Range Status  12/04/2017 12.7 12.0 - 15.0 g/dL Final   HGB  Date Value Ref Range Status  07/14/2013 13.3 12.0 - 16.0 g/dL Final   HCT  Date Value Ref Range Status  12/04/2017 38.1 36.0 - 46.0 % Final  07/14/2013 38.9 35.0 - 47.0 % Final   MCHC  Date Value Ref Range Status  12/04/2017 33.3 30.0 - 36.0 g/dL Final   [facility]  Date Value Ref Range Status  12/04/2017 31.1 26.0 - 34.0 pg Final   MCV  Date Value Ref Range Status  12/04/2017 93.2 80.0 - 100.0 fL Final  07/14/2013 94 80 - 100 fL Final   No results found for: "PLTCOUNTKUC", "LABPLAT", "POCPLA" RDW  Date Value Ref Range Status  12/04/2017 13.1 11.5 - 15.5 % Final  07/14/2013 12.9 11.5 - 14.5 % Final         Passed - Completed PHQ-2 or PHQ-9 in the last 360 days      Passed - Last BP in normal range    BP Readings from Last 1 Encounters:  03/27/22 139/83         Passed - Last Heart Rate in normal range    Pulse Readings from Last 1 Encounters:  03/27/22 73         Passed - Valid encounter within last 6 months    Recent Outpatient Visits           1 month ago Welcome to [employer] preventive visit   [place] [facility] Larae Grooms, NP   3 months ago Wheezing on auscultation   [place] [facility] Mecum, Erin E, PA-C   5 months ago Borderline personality disorder [facility])   [place] [facility] Larae Grooms, NP   7 months ago Severe episode of recurrent major depressive disorder, without psychotic features [facility])   [place] [facility] Larae Grooms, NP   1 year ago Severe episode of recurrent major depressive disorder, without psychotic features [facility])   [place] [facility] Larae Grooms, NP       Future  Appointments             In 1 month Larae Grooms, NP [place] [facility]            Passed - CMP within normal limits and completed in the last 12 months    Albumin  Date Value Ref Range Status  03/27/2022 4.9 3.9 - 4.9 g/dL Final  81/19/1478 4.4 3.4 - 5.0 g/dL Final   Alkaline Phosphatase  Date Value Ref Range Status  03/27/2022 74 44 - 121 IU/L Final  07/14/2013 38 (L) Unit/L Final    Comment:    45-117 NOTE: New Reference Range 12/10/12    ALT  Date Value Ref Range Status  03/27/2022 14 0 - 32 IU/L Final   SGPT (ALT)  Date Value Ref Range Status  07/14/2013 14 12 - 78 U/L Final   AST  Date Value Ref Range Status  03/27/2022 21 0 - 40 IU/L Final   SGOT(AST)  Date Value Ref Range Status  07/14/2013 29 15 - 37 Unit/L Final   BUN  Date Value Ref Range Status  03/27/2022 6 6 - 20 mg/dL Final  29/56/2130 14 7 - 18 mg/dL Final   Calcium  Date Value Ref Range Status  03/27/2022 9.9 8.7 - 10.2 mg/dL Final   Calcium, Total  Date Value Ref Range Status  07/14/2013 8.8 8.5 - 10.1 mg/dL Final   CO2  Date Value Ref Range Status  03/27/2022 22 20 - 29 mmol/L Final   Co2  Date Value Ref Range Status  07/14/2013 26 21 - 32 mmol/L Final   Creatinine  Date Value Ref Range Status  07/14/2013 0.97 0.60 - 1.30 mg/dL Final   Creatinine, Ser  Date Value Ref Range Status  03/27/2022 0.63 0.57 - 1.00 mg/dL Final   Glucose  Date Value Ref Range Status  03/27/2022 85 70 - 99 mg/dL Final  86/57/8469 80 65 - 99 mg/dL Final   Glucose, Bld  Date Value Ref Range Status  12/04/2017 115 (H) 70 - 99 mg/dL Final   Glucose-Capillary  Date Value Ref Range Status  06/30/2014 80 65 - 99 mg/dL Final   Potassium  Date Value Ref Range Status  03/27/2022 3.8 3.5 - 5.2 mmol/L Final  07/14/2013 3.9 3.5 - 5.1 mmol/L Final   Sodium  Date Value Ref Range Status  03/27/2022 139 134 - 144 mmol/L Final  07/14/2013 137 136 - 145 mmol/L Final   Bilirubin,Total  Date Value Ref Range Status  07/14/2013 0.4 0.2 - 1.0 mg/dL Final    Bilirubin Total  Date Value Ref Range Status  03/27/2022 0.2 0.0 - 1.2 mg/dL Final   Protein, ur  Date Value Ref Range Status  07/15/2016 NEGATIVE NEGATIVE mg/dL Final   Total Protein  Date Value Ref Range Status  03/27/2022 7.2 6.0 - 8.5 g/dL Final  62/95/2841 7.3 6.4 - 8.2 g/dL Final   EGFR (African American)  Date Value Ref Range Status  07/14/2013 >60  Final   GFR calc Af Amer  Date Value Ref Range Status  12/04/2017 >60 >60 mL/min Final    Comment:    (NOTE) The eGFR has been calculated using the CKD EPI equation. This calculation has not been validated in all clinical situations. eGFR's persistently <60 mL/min signify possible Chronic Kidney Disease.    eGFR  Date Value Ref Range Status  03/27/2022 120 >59 mL/min/1.73 Final   EGFR (Non-African Amer.)  Date Value Ref Range Status  07/14/2013 >60  Final    Comment:    eGFR values <70mL/min/1.73 m2 may be an indication of chronic kidney disease (CKD). Calculated eGFR is useful in patients with stable renal function. The eGFR calculation will not be reliable in acutely ill patients when serum creatinine is changing rapidly. It is not useful in  patients on dialysis. The eGFR calculation may not be applicable to patients at the low and high extremes of body sizes, pregnant women, and vegetarians.    GFR calc non Af Amer  Date Value Ref Range Status  12/04/2017 >60 >60 mL/min Final

## 2022-05-16 NOTE — Telephone Encounter (Signed)
Medication Refill - Medication:  cyclobenzaprine (FLEXERIL) 10 MG tablet  gabapentin (NEURONTIN) 400 MG capsule QUEtiapine (SEROQUEL) 25 MG tablet    Has the patient contacted their pharmacy? No. (Agent: If no, request that the patient contact the pharmacy for the refill. If patient does not wish to contact the pharmacy document the reason why and proceed with request.) (Agent: If yes, when and what did the pharmacy advise?)  Preferred Pharmacy (with phone number or street name):  [id number] - [place], [place] - [address] AT [facility] [address] Phone: [phone]  Fax: [phone]     Has the patient been seen for an appointment in the last year OR does the patient have an upcoming appointment? Yes.    Agent: Please be advised that RX refills may take up to 3 business days. We ask that you follow-up with your pharmacy.

## 2022-06-01 ENCOUNTER — Emergency Department
Admission: EM | Admit: 2022-06-01 | Discharge: 2022-06-02 | Disposition: A | Discharge status: Home / Self Care | Payer: Medicare Other | Attending: Emergency Medicine | Admitting: Emergency Medicine

## 2022-06-01 DIAGNOSIS — F339 Major depressive disorder, recurrent, unspecified: Secondary | ICD-10-CM | POA: Insufficient documentation

## 2022-06-01 DIAGNOSIS — Z046 Encounter for general psychiatric examination, requested by authority: Secondary | ICD-10-CM

## 2022-06-01 DIAGNOSIS — G894 Chronic pain syndrome: Secondary | ICD-10-CM | POA: Insufficient documentation

## 2022-06-01 DIAGNOSIS — F1910 Other psychoactive substance abuse, uncomplicated: Secondary | ICD-10-CM | POA: Insufficient documentation

## 2022-06-01 DIAGNOSIS — F1410 Cocaine abuse, uncomplicated: Secondary | ICD-10-CM | POA: Diagnosis present

## 2022-06-01 DIAGNOSIS — F1210 Cannabis abuse, uncomplicated: Secondary | ICD-10-CM | POA: Diagnosis not present

## 2022-06-01 DIAGNOSIS — F17210 Nicotine dependence, cigarettes, uncomplicated: Secondary | ICD-10-CM | POA: Insufficient documentation

## 2022-06-01 LAB — CBC
HCT: 31.9 % — ABNORMAL LOW (ref 36.0–46.0)
Hemoglobin: 10.7 g/dL — ABNORMAL LOW (ref 12.0–15.0)
MCH: 31.6 pg (ref 26.0–34.0)
MCHC: 33.5 g/dL (ref 30.0–36.0)
MCV: 94.1 fL (ref 80.0–100.0)
Platelets: 367 10*3/uL (ref 150–400)
RBC: 3.39 MIL/uL — ABNORMAL LOW (ref 3.87–5.11)
RDW: 12.9 % (ref 11.5–15.5)
WBC: 9.2 10*3/uL (ref 4.0–10.5)
nRBC: 0 % (ref 0.0–0.2)

## 2022-06-01 LAB — COMPREHENSIVE METABOLIC PANEL
ALT: 9 U/L (ref 0–44)
AST: 19 U/L (ref 15–41)
Albumin: 4.2 g/dL (ref 3.5–5.0)
Alkaline Phosphatase: 57 U/L (ref 38–126)
Anion gap: 8 (ref 5–15)
BUN: 7 mg/dL (ref 6–20)
CO2: 25 mmol/L (ref 22–32)
Calcium: 9 mg/dL (ref 8.9–10.3)
Chloride: 103 mmol/L (ref 98–111)
Creatinine, Ser: 0.66 mg/dL (ref 0.44–1.00)
GFR, Estimated: >60 mL/min (ref >60)
Glucose, Bld: 108 mg/dL — ABNORMAL HIGH (ref 70–99)
Potassium: 3.8 mmol/L (ref 3.5–5.1)
Sodium: 136 mmol/L (ref 135–145)
Total Bilirubin: 0.6 mg/dL (ref 0.3–1.2)
Total Protein: 6.9 g/dL (ref 6.5–8.1)

## 2022-06-01 LAB — ACETAMINOPHEN LEVEL: Acetaminophen (Tylenol), Serum: <10 ug/mL — ABNORMAL LOW (ref 10–30)

## 2022-06-01 LAB — URINE DRUG SCREEN, QUALITATIVE (ARMC ONLY)
Amphetamines, Ur Screen: NOT DETECTED
Barbiturates, Ur Screen: NOT DETECTED
Benzodiazepine, Ur Scrn: NOT DETECTED
Cannabinoid 50 Ng, Ur ~~LOC~~: POSITIVE — AB
Cocaine Metabolite,Ur ~~LOC~~: POSITIVE — AB
MDMA (Ecstasy)Ur Screen: NOT DETECTED
Methadone Scn, Ur: NOT DETECTED
Opiate, Ur Screen: NOT DETECTED
Phencyclidine (PCP) Ur S: NOT DETECTED
Tricyclic, Ur Screen: POSITIVE — AB

## 2022-06-01 LAB — ETHANOL: Alcohol, Ethyl (B): <10 mg/dL (ref <10)

## 2022-06-01 LAB — SALICYLATE LEVEL: Salicylate Lvl: <7 mg/dL — ABNORMAL LOW (ref 7.0–30.0)
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
## 2022-06-01 NOTE — ED Notes (Signed)
This EDT watched pt dress out along with [employer]. Pt's belongings placed in personal belongings bag are : Pair of black socks Black and red pants Black and red hat Red underwear Black tank top Black sports bra Black tennis shoes  Black tshirt  Black cellphone Cellphone Charger  Black Eye piercing  Black Nose ring [place]

## 2022-06-02 DIAGNOSIS — F1410 Cocaine abuse, uncomplicated: Secondary | ICD-10-CM | POA: Diagnosis not present

## 2022-06-02 DIAGNOSIS — F1910 Other psychoactive substance abuse, uncomplicated: Secondary | ICD-10-CM

## 2022-06-02 DIAGNOSIS — F339 Major depressive disorder, recurrent, unspecified: Secondary | ICD-10-CM

## 2022-06-02 LAB — PREGNANCY, URINE: Preg Test, Ur: NEGATIVE

## 2022-06-02 MED ORDER — QUETIAPINE FUMARATE 25 MG PO TABS: 25.0000 mg | ORAL_TABLET | Freq: Every day | ORAL | Status: DC

## 2022-06-02 MED ORDER — GABAPENTIN 300 MG PO CAPS
400.0000 mg | ORAL_CAPSULE | Freq: Three times a day (TID) | ORAL | Status: DC
  Administered 2022-06-02: 400 mg via ORAL
  Filled 2022-06-02: qty 1
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
## 2022-06-02 NOTE — ED Notes (Signed)
IVC papers rescinded per Dr Clapacs MD 
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
## 2022-06-02 NOTE — ED Notes (Signed)
Patient given lunch tray.
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
## 2022-06-02 NOTE — ED Notes (Signed)
Pt does not want to talk to person identifying as "her wife" and does not want any information shared with her.

## 2022-06-02 NOTE — ED Notes (Signed)
Patient was offered breakfast & she declined it. Patient is currently on the phone. Earlier pt was on the phone with her X Sasha & pt requested that she no longer wants to speak with her.

## 2022-06-27 ENCOUNTER — Encounter: Payer: Self-pay | Admitting: Nurse Practitioner

## 2022-06-27 ENCOUNTER — Ambulatory Visit (INDEPENDENT_AMBULATORY_CARE_PROVIDER_SITE_OTHER): Payer: Medicare Other | Admitting: Nurse Practitioner

## 2022-06-27 VITALS — BP 117/81 | HR 71 | Temp 97.9°F | Wt 136.4 lb

## 2022-06-27 DIAGNOSIS — G894 Chronic pain syndrome: Secondary | ICD-10-CM | POA: Diagnosis not present

## 2022-06-27 DIAGNOSIS — F332 Major depressive disorder, recurrent severe without psychotic features: Secondary | ICD-10-CM

## 2022-06-27 DIAGNOSIS — E559 Vitamin D deficiency, unspecified: Secondary | ICD-10-CM

## 2022-06-27 MED ORDER — KETOROLAC TROMETHAMINE 60 MG/2ML IM SOLN
60.0000 mg | Freq: Once | INTRAMUSCULAR | Status: AC
Start: 2022-06-27 — End: 2022-06-27
  Administered 2022-06-27: 60 mg via INTRAMUSCULAR

## 2022-06-27 NOTE — Assessment & Plan Note (Signed)
Labs ordered at visit today.  Will make recommendations based on lab results.   

## 2022-06-27 NOTE — Assessment & Plan Note (Signed)
Chronic. Not well controlled.  Does feel like the Seroquel helps her symptoms.  Understands that she needs to get into see psychiatry. However, she has not made an appointment at this time.  Denies intentions of suicide.  Discussed actions to take if symptoms worsen. Follow up in 3 months.  Call sooner if concerns arise.

## 2022-06-27 NOTE — Progress Notes (Signed)
BP 117/81   Pulse 71   Temp 97.9 F (36.6 C) (Oral)   Wt 136 lb 6.4 oz (61.9 kg)   LMP 05/30/2022 (Approximate)   SpO2 99%   BMI 20.74 kg/m    Subjective:    Patient ID: Kathy Henry, female    DOB: [birth year], 32 y.o.   MRN: [id number]  HPI: Kathy Henry is a 32 y.o. female  Chief Complaint  Patient presents with   Depression   DEPRESSION/ANXIETY Patient states she has not seen a psychiatrist yet.  The Seroquel is still helping her sleep at night. She is aware that her symptoms are not well controlled.  Is okay if we make the appt for her and let her know when it is.  Does not have thoughts of harming herself but does have thoughts that she would be better off dead.  No plans to commit suicide.  Flowsheet Row Office Visit from 06/27/2022 in [facility]  PHQ-9 Total Score 20         06/27/2022   10:40 AM 03/27/2022    2:15 PM 12/16/2021   10:35 AM 10/15/2021   11:00 AM  GAD 7 : Generalized Anxiety Score  Nervous, Anxious, on Edge 2 3 2 3   Control/stop worrying 3 3 2 3   Worry too much - different things 3 3 2 3   Trouble relaxing 2 3 2 3   Restless 2 2 2 3   Easily annoyed or irritable 3 2 2 2   Afraid - awful might happen 2 2 2 2   Total GAD 7 Score 17 18 14 19   Anxiety Difficulty Very difficult Somewhat difficult Very difficult Not difficult at all     BACK PAIN Patient states her back has been hurting.  She is in a lot of pain and having a lot of pain in her lower back today.  She is hoping to get a toradol shot at visit today.     Relevant past medical, surgical, family and social history reviewed and updated as indicated. Interim medical history since our last visit reviewed. Allergies and medications reviewed and updated.  Review of Systems  Musculoskeletal:  Positive for back pain.  Psychiatric/Behavioral:  Positive for dysphoric mood, sleep disturbance and suicidal ideas. The patient is nervous/anxious.     Per HPI unless  specifically indicated above     Objective:    BP 117/81   Pulse 71   Temp 97.9 F (36.6 C) (Oral)   Wt 136 lb 6.4 oz (61.9 kg)   LMP 05/30/2022 (Approximate)   SpO2 99%   BMI 20.74 kg/m   Wt Readings from Last 3 Encounters:  06/27/22 136 lb 6.4 oz (61.9 kg)  06/01/22 135 lb (61.2 kg)  03/27/22 135 lb 3.2 oz (61.3 kg)    Physical Exam Vitals and nursing note reviewed.  Constitutional:      General: She is not in acute distress.    Appearance: Normal appearance. She is normal weight. She is not ill-appearing, toxic-appearing or diaphoretic.  HENT:     Head: Normocephalic.     Right Ear: External ear normal.     Left Ear: External ear normal.     Nose: Nose normal.     Mouth/Throat:     Mouth: Mucous membranes are moist.     Pharynx: Oropharynx is clear.  Eyes:     General:        Right eye: No discharge.  Left eye: No discharge.     Extraocular Movements: Extraocular movements intact.     Conjunctiva/sclera: Conjunctivae normal.     Pupils: Pupils are equal, round, and reactive to light.  Cardiovascular:     Rate and Rhythm: Normal rate and regular rhythm.     Heart sounds: No murmur heard. Pulmonary:     Effort: Pulmonary effort is normal. No respiratory distress.     Breath sounds: Normal breath sounds. No wheezing or rales.  Abdominal:     General: Abdomen is flat. There is no distension.     Tenderness: There is no abdominal tenderness. There is no right CVA tenderness, left CVA tenderness or guarding.  Musculoskeletal:     Cervical back: Normal range of motion and neck supple.     Comments: Walks with cane and wears brace on left leg  Skin:    General: Skin is warm and dry.     Capillary Refill: Capillary refill takes less than 2 seconds.  Neurological:     General: No focal deficit present.     Mental Status: She is alert and oriented to person, place, and time. Mental status is at baseline.  Psychiatric:        Mood and Affect: Mood normal.         Behavior: Behavior normal.        Thought Content: Thought content normal.        Judgment: Judgment normal.     Results for orders placed or performed during the hospital encounter of 06/01/22  Comprehensive metabolic panel  Result Value Ref Range   Sodium 136 135 - 145 mmol/L   Potassium 3.8 3.5 - 5.1 mmol/L   Chloride 103 98 - 111 mmol/L   CO2 25 22 - 32 mmol/L   Glucose, Bld 108 (H) 70 - 99 mg/dL   BUN 7 6 - 20 mg/dL   Creatinine, Ser 7.82 0.44 - 1.00 mg/dL   Calcium 9.0 8.9 - 95.6 mg/dL   Total Protein 6.9 6.5 - 8.1 g/dL   Albumin 4.2 3.5 - 5.0 g/dL   AST 19 15 - 41 U/L   ALT 9 0 - 44 U/L   Alkaline Phosphatase 57 38 - 126 U/L   Total Bilirubin 0.6 0.3 - 1.2 mg/dL   GFR, Estimated >21 >30 mL/min   Anion gap 8 5 - 15  Ethanol  Result Value Ref Range   Alcohol, Ethyl (B) <10 <10 mg/dL  Salicylate level  Result Value Ref Range   Salicylate Lvl <7.0 (L) 7.0 - 30.0 mg/dL  Acetaminophen level  Result Value Ref Range   Acetaminophen (Tylenol), Serum <10 (L) 10 - 30 ug/mL  cbc  Result Value Ref Range   WBC 9.2 4.0 - 10.5 K/uL   RBC 3.39 (L) 3.87 - 5.11 MIL/uL   Hemoglobin 10.7 (L) 12.0 - 15.0 g/dL   HCT 86.5 (L) 78.4 - 69.6 %   MCV 94.1 80.0 - 100.0 fL   MCH 31.6 26.0 - 34.0 pg   MCHC 33.5 30.0 - 36.0 g/dL   RDW 29.5 28.4 - 13.2 %   Platelets 367 150 - 400 K/uL   nRBC 0.0 0.0 - 0.2 %  Urine Drug Screen, Qualitative  Result Value Ref Range   Tricyclic, Ur Screen POSITIVE (A) NONE DETECTED   Amphetamines, Ur Screen NONE DETECTED NONE DETECTED   MDMA (Ecstasy)Ur Screen NONE DETECTED NONE DETECTED   Cocaine Metabolite,Ur [place] POSITIVE (A) NONE DETECTED   Opiate, Ur Screen  NONE DETECTED NONE DETECTED   Phencyclidine (PCP) Ur S NONE DETECTED NONE DETECTED   Cannabinoid 50 Ng, Ur [place] POSITIVE (A) NONE DETECTED   Barbiturates, Ur Screen NONE DETECTED NONE DETECTED   Benzodiazepine, Ur Scrn NONE DETECTED NONE DETECTED   Methadone Scn, Ur NONE DETECTED NONE DETECTED   Pregnancy, urine  Result Value Ref Range   Preg Test, Ur NEGATIVE NEGATIVE      Assessment & Plan:   Problem List Items Addressed This Visit       Other   Chronic pain syndrome (Chronic)    Chronic. Ongoing.  Not well controlled.  Receiving spine injections every 3 months.  Encouraged patient to continue follow up with pain specialist. Will place referral for Ortho due to new bulging disc found.  Hoping she can see pain management [facility].  Also recommend following up with spine surgeon.  Toradol shot given in office today.       Relevant Medications   ketorolac (TORADOL) injection 60 mg (Start on 06/27/2022 11:00 AM)   Other Relevant Orders   Comp Met (CMET)   MDD (major depressive disorder), recurrent episode, severe (HCC)    Chronic. Not well controlled.  Does feel like the Seroquel helps her symptoms.  Understands that she needs to get into see psychiatry. However, she has not made an appointment at this time.  Denies intentions of suicide.  Discussed actions to take if symptoms worsen. Follow up in 3 months.  Call sooner if concerns arise.       Relevant Orders   Comp Met (CMET)   Vitamin D deficiency - Primary    Labs ordered at visit today.  Will make recommendations based on lab results.        Relevant Orders   Vitamin D (25 hydroxy)     Follow up plan: Return in about 3 months (around 09/27/2022) for HTN, HLD, DM2 FU.    A total of 30 minutes were spent on this encounter today.  When total time is documented, this includes both the face-to-face and non-face-to-face time personally spent before, during and after the visit on the date of the encounter reviewing specialist appointments and plan of care for pain.

## 2022-06-27 NOTE — Assessment & Plan Note (Signed)
Chronic. Ongoing.  Not well controlled.  Receiving spine injections every 3 months.  Encouraged patient to continue follow up with pain specialist. Will place referral for Ortho due to new bulging disc found.  Hoping she can see pain management [facility].  Also recommend following up with spine surgeon.  Toradol shot given in office today.

## 2022-07-28 ENCOUNTER — Other Ambulatory Visit: Payer: Self-pay | Admitting: Nurse Practitioner

## 2022-07-28 DIAGNOSIS — M546 Pain in thoracic spine: Secondary | ICD-10-CM

## 2022-07-28 NOTE — Telephone Encounter (Signed)
Medication Refill - Medication:  gabapentin (NEURONTIN) 400 MG capsule  Completely out  Has the patient contacted their pharmacy? Yes.    Preferred Pharmacy (with phone number or street name): [id number] - [place], [place] - [address] AT [facility] [address] Phone: [phone] Fax: [phone]  Has the patient been seen for an appointment in the last year OR does the patient have an upcoming appointment? Yes.  F/U on 09/26/2022  Patients contact #: [phone]

## 2022-07-29 MED ORDER — GABAPENTIN 400 MG PO CAPS
400.0000 mg | ORAL_CAPSULE | Freq: Three times a day (TID) | ORAL | 1 refills | Status: DC
Start: 2022-07-29 — End: 2022-09-26

## 2022-07-29 NOTE — Telephone Encounter (Signed)
Requested by interface surescripts.  Requested Prescriptions  Pending Prescriptions Disp Refills   gabapentin (NEURONTIN) 400 MG capsule 270 capsule 1    Sig: Take 1 capsule (400 mg total) by mouth 3 (three) times daily.     Neurology: Anticonvulsants - gabapentin Passed - 07/28/2022  1:17 PM      Passed - Cr in normal range and within 360 days    Creatinine  Date Value Ref Range Status  07/14/2013 0.97 0.60 - 1.30 mg/dL Final   Creatinine, Ser  Date Value Ref Range Status  06/01/2022 0.66 0.44 - 1.00 mg/dL Final         Passed - Completed PHQ-2 or PHQ-9 in the last 360 days      Passed - Valid encounter within last 12 months    Recent Outpatient Visits           1 month ago Vitamin D deficiency   [place] [facility] Larae Grooms, NP   4 months ago Welcome to [employer] preventive visit   [place] [facility] Larae Grooms, NP   6 months ago Wheezing on auscultation   [place] [facility] Mecum, Erin E, PA-C   7 months ago Borderline personality disorder [facility])   [place] [facility] Larae Grooms, NP   9 months ago Severe episode of recurrent major depressive disorder, without psychotic features (HCC)   [place] [facility] Larae Grooms, NP       Future Appointments             In 1 month Mecum, Oswaldo Conroy, PA-C  [facility]

## 2022-08-15 ENCOUNTER — Other Ambulatory Visit: Payer: Self-pay | Admitting: Nurse Practitioner

## 2022-08-15 NOTE — Telephone Encounter (Signed)
Requested medication (s) are due for refill today - no  Requested medication (s) are on the active medication list -yes  Future visit scheduled -yes  Last refill: 03/27/22 #90 1RF  Notes to clinic: non delegated Rx  Requested Prescriptions  Pending Prescriptions Disp Refills   QUEtiapine (SEROQUEL) 25 MG tablet 90 tablet 1    Sig: Take 1 tablet (25 mg total) by mouth at bedtime.     Not Delegated - Psychiatry:  Antipsychotics - Second Generation (Atypical) - quetiapine Failed - 08/15/2022 12:38 PM      Failed - This refill cannot be delegated      Failed - TSH in normal range and within 360 days    TSH  Date Value Ref Range Status  08/17/2015 1.824 0.350 - 4.500 uIU/mL Final         Failed - Lipid Panel in normal range within the last 12 months    Cholesterol, Total  Date Value Ref Range Status  03/27/2022 176 100 - 199 mg/dL Final   LDL Chol Calc (NIH)  Date Value Ref Range Status  03/27/2022 94 0 - 99 mg/dL Final   HDL  Date Value Ref Range Status  03/27/2022 65 >39 mg/dL Final   Triglycerides  Date Value Ref Range Status  03/27/2022 92 0 - 149 mg/dL Final         Failed - CBC within normal limits and completed in the last 12 months    WBC  Date Value Ref Range Status  06/01/2022 9.2 4.0 - 10.5 K/uL Final   RBC  Date Value Ref Range Status  06/01/2022 3.39 (L) 3.87 - 5.11 MIL/uL Final   Hemoglobin  Date Value Ref Range Status  06/01/2022 10.7 (L) 12.0 - 15.0 g/dL Final   HGB  Date Value Ref Range Status  07/14/2013 13.3 12.0 - 16.0 g/dL Final   HCT  Date Value Ref Range Status  06/01/2022 31.9 (L) 36.0 - 46.0 % Final  07/14/2013 38.9 35.0 - 47.0 % Final   MCHC  Date Value Ref Range Status  06/01/2022 33.5 30.0 - 36.0 g/dL Final   [facility]  Date Value Ref Range Status  06/01/2022 31.6 26.0 - 34.0 pg Final   MCV  Date Value Ref Range Status  06/01/2022 94.1 80.0 - 100.0 fL Final  07/14/2013 94 80 - 100 fL Final   No results found for:  "PLTCOUNTKUC", "LABPLAT", "POCPLA" RDW  Date Value Ref Range Status  06/01/2022 12.9 11.5 - 15.5 % Final  07/14/2013 12.9 11.5 - 14.5 % Final         Passed - Completed PHQ-2 or PHQ-9 in the last 360 days      Passed - Last BP in normal range    BP Readings from Last 1 Encounters:  06/27/22 117/81         Passed - Last Heart Rate in normal range    Pulse Readings from Last 1 Encounters:  06/27/22 71         Passed - Valid encounter within last 6 months    Recent Outpatient Visits           1 month ago Vitamin D deficiency   [place] [facility] Larae Grooms, NP   4 months ago Welcome to [employer] preventive visit   [place] [facility] Larae Grooms, NP   6 months ago Wheezing on auscultation   [place] [facility] Mecum, Oswaldo Conroy, PA-C   8 months  ago Borderline personality disorder [facility])   [place] [facility] Larae Grooms, NP   10 months ago Severe episode of recurrent major depressive disorder, without psychotic features (HCC)   [place] [facility] Larae Grooms, NP       Future Appointments             In 1 month Mecum, Oswaldo Conroy, PA-C [facility]            Passed - CMP within normal limits and completed in the last 12 months    Albumin  Date Value Ref Range Status  06/01/2022 4.2 3.5 - 5.0 g/dL Final  16/10/9602 4.9 3.9 - 4.9 g/dL Final  54/09/8117 4.4 3.4 - 5.0 g/dL Final   Alkaline Phosphatase  Date Value Ref Range Status  06/01/2022 57 38 - 126 U/L Final  07/14/2013 38 (L) Unit/L Final    Comment:    45-117 NOTE: New Reference Range 12/10/12    ALT  Date Value Ref Range Status  06/01/2022 9 0 - 44 U/L Final   SGPT (ALT)  Date Value Ref Range Status  07/14/2013 14 12 - 78 U/L Final   AST  Date Value Ref Range Status  06/01/2022 19 15 - 41 U/L Final   SGOT(AST)  Date Value Ref Range Status  07/14/2013 29 15 - 37  Unit/L Final   BUN  Date Value Ref Range Status  06/01/2022 7 6 - 20 mg/dL Final  14/78/2956 6 6 - 20 mg/dL Final  21/30/8657 14 7 - 18 mg/dL Final   Calcium  Date Value Ref Range Status  06/01/2022 9.0 8.9 - 10.3 mg/dL Final   Calcium, Total  Date Value Ref Range Status  07/14/2013 8.8 8.5 - 10.1 mg/dL Final   CO2  Date Value Ref Range Status  06/01/2022 25 22 - 32 mmol/L Final   Co2  Date Value Ref Range Status  07/14/2013 26 21 - 32 mmol/L Final   Creatinine  Date Value Ref Range Status  07/14/2013 0.97 0.60 - 1.30 mg/dL Final   Creatinine, Ser  Date Value Ref Range Status  06/01/2022 0.66 0.44 - 1.00 mg/dL Final   Glucose  Date Value Ref Range Status  07/14/2013 80 65 - 99 mg/dL Final   Glucose, Bld  Date Value Ref Range Status  06/01/2022 108 (H) 70 - 99 mg/dL Final    Comment:    Glucose reference range applies only to samples taken after fasting for at least 8 hours.   Glucose-Capillary  Date Value Ref Range Status  06/30/2014 80 65 - 99 mg/dL Final   Potassium  Date Value Ref Range Status  06/01/2022 3.8 3.5 - 5.1 mmol/L Final  07/14/2013 3.9 3.5 - 5.1 mmol/L Final   Sodium  Date Value Ref Range Status  06/01/2022 136 135 - 145 mmol/L Final  03/27/2022 139 134 - 144 mmol/L Final  07/14/2013 137 136 - 145 mmol/L Final   Total Bilirubin  Date Value Ref Range Status  06/01/2022 0.6 0.3 - 1.2 mg/dL Final   Bilirubin,Total  Date Value Ref Range Status  07/14/2013 0.4 0.2 - 1.0 mg/dL Final   Bilirubin Total  Date Value Ref Range Status  03/27/2022 0.2 0.0 - 1.2 mg/dL Final   Protein, ur  Date Value Ref Range Status  07/15/2016 NEGATIVE NEGATIVE mg/dL Final   Total Protein  Date Value Ref Range Status  06/01/2022 6.9 6.5 - 8.1 g/dL Final  84/69/6295 7.2  6.0 - 8.5 g/dL Final  72/53/6644 7.3 6.4 - 8.2 g/dL Final   EGFR (African American)  Date Value Ref Range Status  07/14/2013 >60  Final   GFR calc Af Amer  Date Value Ref Range  Status  12/04/2017 >60 >60 mL/min Final    Comment:    (NOTE) The eGFR has been calculated using the CKD EPI equation. This calculation has not been validated in all clinical situations. eGFR's persistently <60 mL/min signify possible Chronic Kidney Disease.    eGFR  Date Value Ref Range Status  03/27/2022 120 >59 mL/min/1.73 Final   EGFR (Non-African Amer.)  Date Value Ref Range Status  07/14/2013 >60  Final    Comment:    eGFR values <72mL/min/1.73 m2 may be an indication of chronic kidney disease (CKD). Calculated eGFR is useful in patients with stable renal function. The eGFR calculation will not be reliable in acutely ill patients when serum creatinine is changing rapidly. It is not useful in  patients on dialysis. The eGFR calculation may not be applicable to patients at the low and high extremes of body sizes, pregnant women, and vegetarians.    GFR, Estimated  Date Value Ref Range Status  06/01/2022 >60 >60 mL/min Final    Comment:    (NOTE) Calculated using the CKD-EPI Creatinine Equation (2021)             Requested Prescriptions  Pending Prescriptions Disp Refills   QUEtiapine (SEROQUEL) 25 MG tablet 90 tablet 1    Sig: Take 1 tablet (25 mg total) by mouth at bedtime.     Not Delegated - Psychiatry:  Antipsychotics - Second Generation (Atypical) - quetiapine Failed - 08/15/2022 12:38 PM      Failed - This refill cannot be delegated      Failed - TSH in normal range and within 360 days    TSH  Date Value Ref Range Status  08/17/2015 1.824 0.350 - 4.500 uIU/mL Final         Failed - Lipid Panel in normal range within the last 12 months    Cholesterol, Total  Date Value Ref Range Status  03/27/2022 176 100 - 199 mg/dL Final   LDL Chol Calc (NIH)  Date Value Ref Range Status  03/27/2022 94 0 - 99 mg/dL Final   HDL  Date Value Ref Range Status  03/27/2022 65 >39 mg/dL Final   Triglycerides  Date Value Ref Range Status  03/27/2022 92 0 - 149  mg/dL Final         Failed - CBC within normal limits and completed in the last 12 months    WBC  Date Value Ref Range Status  06/01/2022 9.2 4.0 - 10.5 K/uL Final   RBC  Date Value Ref Range Status  06/01/2022 3.39 (L) 3.87 - 5.11 MIL/uL Final   Hemoglobin  Date Value Ref Range Status  06/01/2022 10.7 (L) 12.0 - 15.0 g/dL Final   HGB  Date Value Ref Range Status  07/14/2013 13.3 12.0 - 16.0 g/dL Final   HCT  Date Value Ref Range Status  06/01/2022 31.9 (L) 36.0 - 46.0 % Final  07/14/2013 38.9 35.0 - 47.0 % Final   MCHC  Date Value Ref Range Status  06/01/2022 33.5 30.0 - 36.0 g/dL Final   [facility]  Date Value Ref Range Status  06/01/2022 31.6 26.0 - 34.0 pg Final   MCV  Date Value Ref Range Status  06/01/2022 94.1 80.0 - 100.0 fL Final  07/14/2013 94  80 - 100 fL Final   No results found for: "PLTCOUNTKUC", "LABPLAT", "POCPLA" RDW  Date Value Ref Range Status  06/01/2022 12.9 11.5 - 15.5 % Final  07/14/2013 12.9 11.5 - 14.5 % Final         Passed - Completed PHQ-2 or PHQ-9 in the last 360 days      Passed - Last BP in normal range    BP Readings from Last 1 Encounters:  06/27/22 117/81         Passed - Last Heart Rate in normal range    Pulse Readings from Last 1 Encounters:  06/27/22 71         Passed - Valid encounter within last 6 months    Recent Outpatient Visits           1 month ago Vitamin D deficiency   [place] [facility] Larae Grooms, NP   4 months ago Welcome to [employer] preventive visit   [place] [facility] Larae Grooms, NP   6 months ago Wheezing on auscultation   [place] [facility] Mecum, Erin E, PA-C   8 months ago Borderline personality disorder [facility])   [place] [facility] Larae Grooms, NP   10 months ago Severe episode of recurrent major depressive disorder, without psychotic features (HCC)   [place] [facility] Larae Grooms,  NP       Future Appointments             In 1 month Mecum, Oswaldo Conroy, PA-C [facility]            Passed - CMP within normal limits and completed in the last 12 months    Albumin  Date Value Ref Range Status  06/01/2022 4.2 3.5 - 5.0 g/dL Final  16/10/9602 4.9 3.9 - 4.9 g/dL Final  54/09/8117 4.4 3.4 - 5.0 g/dL Final   Alkaline Phosphatase  Date Value Ref Range Status  06/01/2022 57 38 - 126 U/L Final  07/14/2013 38 (L) Unit/L Final    Comment:    45-117 NOTE: New Reference Range 12/10/12    ALT  Date Value Ref Range Status  06/01/2022 9 0 - 44 U/L Final   SGPT (ALT)  Date Value Ref Range Status  07/14/2013 14 12 - 78 U/L Final   AST  Date Value Ref Range Status  06/01/2022 19 15 - 41 U/L Final   SGOT(AST)  Date Value Ref Range Status  07/14/2013 29 15 - 37 Unit/L Final   BUN  Date Value Ref Range Status  06/01/2022 7 6 - 20 mg/dL Final  14/78/2956 6 6 - 20 mg/dL Final  21/30/8657 14 7 - 18 mg/dL Final   Calcium  Date Value Ref Range Status  06/01/2022 9.0 8.9 - 10.3 mg/dL Final   Calcium, Total  Date Value Ref Range Status  07/14/2013 8.8 8.5 - 10.1 mg/dL Final   CO2  Date Value Ref Range Status  06/01/2022 25 22 - 32 mmol/L Final   Co2  Date Value Ref Range Status  07/14/2013 26 21 - 32 mmol/L Final   Creatinine  Date Value Ref Range Status  07/14/2013 0.97 0.60 - 1.30 mg/dL Final   Creatinine, Ser  Date Value Ref Range Status  06/01/2022 0.66 0.44 - 1.00 mg/dL Final   Glucose  Date Value Ref Range Status  07/14/2013 80 65 - 99 mg/dL Final   Glucose, Bld  Date Value  Ref Range Status  06/01/2022 108 (H) 70 - 99 mg/dL Final    Comment:    Glucose reference range applies only to samples taken after fasting for at least 8 hours.   Glucose-Capillary  Date Value Ref Range Status  06/30/2014 80 65 - 99 mg/dL Final   Potassium  Date Value Ref Range Status  06/01/2022 3.8 3.5 - 5.1 mmol/L Final   07/14/2013 3.9 3.5 - 5.1 mmol/L Final   Sodium  Date Value Ref Range Status  06/01/2022 136 135 - 145 mmol/L Final  03/27/2022 139 134 - 144 mmol/L Final  07/14/2013 137 136 - 145 mmol/L Final   Total Bilirubin  Date Value Ref Range Status  06/01/2022 0.6 0.3 - 1.2 mg/dL Final   Bilirubin,Total  Date Value Ref Range Status  07/14/2013 0.4 0.2 - 1.0 mg/dL Final   Bilirubin Total  Date Value Ref Range Status  03/27/2022 0.2 0.0 - 1.2 mg/dL Final   Protein, ur  Date Value Ref Range Status  07/15/2016 NEGATIVE NEGATIVE mg/dL Final   Total Protein  Date Value Ref Range Status  06/01/2022 6.9 6.5 - 8.1 g/dL Final  60/45/4098 7.2 6.0 - 8.5 g/dL Final  11/91/4782 7.3 6.4 - 8.2 g/dL Final   EGFR (African American)  Date Value Ref Range Status  07/14/2013 >60  Final   GFR calc Af Amer  Date Value Ref Range Status  12/04/2017 >60 >60 mL/min Final    Comment:    (NOTE) The eGFR has been calculated using the CKD EPI equation. This calculation has not been validated in all clinical situations. eGFR's persistently <60 mL/min signify possible Chronic Kidney Disease.    eGFR  Date Value Ref Range Status  03/27/2022 120 >59 mL/min/1.73 Final   EGFR (Non-African Amer.)  Date Value Ref Range Status  07/14/2013 >60  Final    Comment:    eGFR values <15mL/min/1.73 m2 may be an indication of chronic kidney disease (CKD). Calculated eGFR is useful in patients with stable renal function. The eGFR calculation will not be reliable in acutely ill patients when serum creatinine is changing rapidly. It is not useful in  patients on dialysis. The eGFR calculation may not be applicable to patients at the low and high extremes of body sizes, pregnant women, and vegetarians.    GFR, Estimated  Date Value Ref Range Status  06/01/2022 >60 >60 mL/min Final    Comment:    (NOTE) Calculated using the CKD-EPI Creatinine Equation (2021)

## 2022-08-15 NOTE — Telephone Encounter (Signed)
Copied from CRM [id number]. Topic: General - Other >> Aug 15, 2022 11:56 AM Everette C wrote: Reason for CRM: Medication Refill - Medication: QUEtiapine (SEROQUEL) 25 MG tablet [[id number]]    Has the patient contacted their pharmacy? Yes.   (Agent: If no, request that the patient contact the pharmacy for the refill. If patient does not wish to contact the pharmacy document the reason why and proceed with request.) (Agent: If yes, when and what did the pharmacy advise?)  Preferred Pharmacy (with phone number or street name): [id number] Jeanice Lim, [place] - [address] AT [facility] [address] [place] [address] Phone: [phone] Fax: [phone] Hours: Not open 24 hours   Has the patient been seen for an appointment in the last year OR does the patient have an upcoming appointment? Yes.    Agent: Please be advised that RX refills may take up to 3 business days. We ask that you follow-up with your pharmacy.

## 2022-08-19 ENCOUNTER — Ambulatory Visit: Payer: Self-pay | Admitting: *Deleted

## 2022-08-19 NOTE — Telephone Encounter (Signed)
Two attempts to reach pt, VM full. Routing to practice for PCPs resolution per protocol.

## 2022-08-19 NOTE — Telephone Encounter (Signed)
Summary: sleep concern / rx req   The patient shares that they have experienced significant sleep difficulty without their  QUEtiapine (SEROQUEL) 25 MG tablet [[id number]] prescription  The patient shares that they have been unable to sleep since 08/16/22  The patient would like to further discuss their concerns with a member of clinical staff  Please contact further when available

## 2022-09-26 ENCOUNTER — Ambulatory Visit (INDEPENDENT_AMBULATORY_CARE_PROVIDER_SITE_OTHER): Payer: Medicare Other | Admitting: Family Medicine

## 2022-09-26 ENCOUNTER — Telehealth: Payer: Self-pay | Admitting: Nurse Practitioner

## 2022-09-26 ENCOUNTER — Encounter: Payer: Self-pay | Admitting: Family Medicine

## 2022-09-26 VITALS — BP 129/82 | HR 80 | Ht 67.0 in | Wt 139.0 lb

## 2022-09-26 DIAGNOSIS — E559 Vitamin D deficiency, unspecified: Secondary | ICD-10-CM | POA: Diagnosis not present

## 2022-09-26 DIAGNOSIS — F332 Major depressive disorder, recurrent severe without psychotic features: Secondary | ICD-10-CM

## 2022-09-26 DIAGNOSIS — G894 Chronic pain syndrome: Secondary | ICD-10-CM | POA: Diagnosis not present

## 2022-09-26 MED ORDER — THERA VITAL M PO TABS: 1.0000 | ORAL_TABLET | Freq: Every day | ORAL | 2 refills | Status: AC

## 2022-09-26 MED ORDER — PREGABALIN 50 MG PO CAPS: 50.0000 mg | ORAL_CAPSULE | Freq: Two times a day (BID) | ORAL | 2 refills | Status: AC

## 2022-09-26 MED ORDER — DICLOFENAC SODIUM 1 % EX GEL: 4.0000 g | Freq: Four times a day (QID) | CUTANEOUS | 2 refills | Status: AC | PRN

## 2022-09-26 MED ORDER — CELECOXIB 200 MG PO CAPS: 200.0000 mg | ORAL_CAPSULE | Freq: Two times a day (BID) | ORAL | 2 refills | Status: AC | PRN

## 2022-09-26 MED ORDER — BISACODYL 10 MG RE SUPP: 10.0000 mg | RECTAL | 0 refills | Status: AC | PRN

## 2022-09-26 MED ORDER — KETOROLAC TROMETHAMINE 60 MG/2ML IM SOLN
30.0000 mg | Freq: Once | INTRAMUSCULAR | Status: AC
Start: 2022-09-26 — End: 2022-09-26
  Administered 2022-09-26: 30 mg via INTRAMUSCULAR

## 2022-09-26 MED ORDER — TIZANIDINE HCL 4 MG PO CAPS: 4.0000 mg | ORAL_CAPSULE | Freq: Three times a day (TID) | ORAL | 2 refills | Status: AC | PRN

## 2022-09-26 MED ORDER — QUETIAPINE FUMARATE 50 MG PO TABS: 50.0000 mg | ORAL_TABLET | Freq: Every day | ORAL | 0 refills | Status: AC

## 2022-09-26 NOTE — Progress Notes (Unsigned)
BP 129/82   Pulse 80   Ht 5\' 7"  (1.702 m)   Wt 139 lb (63 kg)   SpO2 100%   BMI 21.77 kg/m    Subjective:    Patient ID: Kathy Henry, female    DOB: 1990-02-02, 32 y.o.   MRN: [id number]  HPI: Kathy Henry is a 32 y.o. female  Chief Complaint  Patient presents with   Diabetes   Hyperlipidemia   Hypertension   Hospitalization Follow-up    Patient says she was recently hospitalized and admitted back in June/July. Patient says she is supposed to be using a wheelchair and was informed that she has a growth on your her spine, but has yet to hear about what's the next recommended steps.    Medication Refill   She was hospitalized for one week of bilateral leg weakness, he reports noticing that her right leg felt like it was giving way on her leading to difficulty ambulating, furniture walking in addition to using her cane  MRI showed fluid collection in paraspinal with no cord compression  Pain Management was consulted, who recommended adjustments to her regimen as follows: Tylenol 975mg  TID, Celebrex 100mg  BID for 2-4 weeks, Lyrica 50mg  TID, Tizanidine 4mg  TID, lidocaine patches, physical therapy 1 month for twice weekly   Vitamin D follow up: and mood follow up She has been taking Vitamin D tablets, she has stopped taking the vitamin D tablets.  Recheck vitamin D today.    DEPRESSION She feels the seroquel is working 25MG  Mood status: stable Satisfied with current treatment?: yes Symptom severity: moderate  Duration of current treatment : chronic Side effects: no Medication compliance: excellent compliance Psychotherapy/counseling: Yes in the past Previous psychiatric medications: Xanax and Klonopin, Trazazone, Prozac Depressed mood: yes Anxious mood: yes Anhedonia: yes Significant weight loss or gain: no Insomnia: yes hard to stay asleep 4 -5 hours better than what it was Fatigue: yes Feelings of worthlessness or guilt: yes Impaired concentration/indecisiveness:  no Suicidal ideations: no Hopelessness: yes Crying spells: no    09/26/2022   10:57 AM 06/27/2022   10:40 AM 03/27/2022    2:14 PM 12/16/2021   10:34 AM 10/15/2021   11:00 AM  Depression screen PHQ 2/9  Decreased Interest 2 2 2 2 2   Down, Depressed, Hopeless 2 2 2 2 2   PHQ - 2 Score 4 4 4 4 4   Altered sleeping 3 2 1 1 2   Tired, decreased energy 2 3 2 2 3   Change in appetite 3 2 3 3 3   Feeling bad or failure about yourself  2 2 2 2 2   Trouble concentrating 2 3 3 3 3   Moving slowly or fidgety/restless 2 2 2 2 3   Suicidal thoughts 1 2 2 3 2   PHQ-9 Score 19 20 19 20 22   Difficult doing work/chores Very difficult Very difficult Somewhat difficult Very difficult Very difficult     Chronic pain syndrome She cannot do anymore spinal injections due to growth being found on her spine. Only had one spinal injection. She has new referral to pain specialist, needs to make an appointment for this.   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  Patient states her back has been hurting.  She is in a lot of pain and having a lot of pain in her lower back today.  She is hoping to get a toradol shot at visit today.    Relevant past medical, surgical, family  and social history reviewed and updated as indicated. Interim medical history since our last visit reviewed. Allergies and medications reviewed and updated.  Review of Systems  Respiratory: Negative.    Cardiovascular: Negative.   Psychiatric/Behavioral:  Positive for behavioral problems and confusion.    L foot drop.  Per HPI unless specifically indicated above     Objective:    BP 129/82   Pulse 80   Ht 5\' 7"  (1.702 m)   Wt 139 lb (63 kg)   SpO2 100%   BMI 21.77 kg/m   Wt Readings from Last 3 Encounters:  09/26/22 139 lb (63 kg)  06/27/22 136 lb 6.4 oz (61.9 kg)  06/01/22 135 lb (61.2 kg)    Physical Exam  Results for orders placed or performed during the hospital encounter of 06/01/22   Comprehensive metabolic panel  Result Value Ref Range   Sodium 136 135 - 145 mmol/L   Potassium 3.8 3.5 - 5.1 mmol/L   Chloride 103 98 - 111 mmol/L   CO2 25 22 - 32 mmol/L   Glucose, Bld 108 (H) 70 - 99 mg/dL   BUN 7 6 - 20 mg/dL   Creatinine, Ser 1.61 0.44 - 1.00 mg/dL   Calcium 9.0 8.9 - 09.6 mg/dL   Total Protein 6.9 6.5 - 8.1 g/dL   Albumin 4.2 3.5 - 5.0 g/dL   AST 19 15 - 41 U/L   ALT 9 0 - 44 U/L   Alkaline Phosphatase 57 38 - 126 U/L   Total Bilirubin 0.6 0.3 - 1.2 mg/dL   GFR, Estimated >04 >54 mL/min   Anion gap 8 5 - 15  Ethanol  Result Value Ref Range   Alcohol, Ethyl (B) <10 <10 mg/dL  Salicylate level  Result Value Ref Range   Salicylate Lvl <7.0 (L) 7.0 - 30.0 mg/dL  Acetaminophen level  Result Value Ref Range   Acetaminophen (Tylenol), Serum <10 (L) 10 - 30 ug/mL  cbc  Result Value Ref Range   WBC 9.2 4.0 - 10.5 K/uL   RBC 3.39 (L) 3.87 - 5.11 MIL/uL   Hemoglobin 10.7 (L) 12.0 - 15.0 g/dL   HCT 09.8 (L) 11.9 - 14.7 %   MCV 94.1 80.0 - 100.0 fL   MCH 31.6 26.0 - 34.0 pg   MCHC 33.5 30.0 - 36.0 g/dL   RDW 82.9 56.2 - 13.0 %   Platelets 367 150 - 400 K/uL   nRBC 0.0 0.0 - 0.2 %  Urine Drug Screen, Qualitative  Result Value Ref Range   Tricyclic, Ur Screen POSITIVE (A) NONE DETECTED   Amphetamines, Ur Screen NONE DETECTED NONE DETECTED   MDMA (Ecstasy)Ur Screen NONE DETECTED NONE DETECTED   Cocaine Metabolite,Ur [place] POSITIVE (A) NONE DETECTED   Opiate, Ur Screen NONE DETECTED NONE DETECTED   Phencyclidine (PCP) Ur S NONE DETECTED NONE DETECTED   Cannabinoid 50 Ng, Ur [place] POSITIVE (A) NONE DETECTED   Barbiturates, Ur Screen NONE DETECTED NONE DETECTED   Benzodiazepine, Ur Scrn NONE DETECTED NONE DETECTED   Methadone Scn, Ur NONE DETECTED NONE DETECTED  Pregnancy, urine  Result Value Ref Range   Preg Test, Ur NEGATIVE NEGATIVE      Assessment & Plan:   Problem List Items Addressed This Visit   None    Follow up plan: No follow-ups on  file.

## 2022-09-26 NOTE — Patient Instructions (Addendum)
Do not take Lyrica and Zanaflex with Seroquel   Do not take Flexeril and Zanaflex together

## 2022-09-26 NOTE — Telephone Encounter (Signed)
Copied from CRM [id number]. Topic: General - Other >> Sep 26, 2022  3:26 PM Everette C wrote: Reason for CRM: The patient would like to speak with Prov. Pearley directly when possible regarding their appt earlier today 09/26/22  The patient would like to apply for handicap placard when possible   Please contact further when possible

## 2022-09-27 LAB — VITAMIN D 25 HYDROXY (VIT D DEFICIENCY, FRACTURES): Vit D, 25-Hydroxy: 8.8 ng/mL — ABNORMAL LOW (ref 30.0–100.0)

## 2022-09-28 NOTE — Assessment & Plan Note (Signed)
Chronic. Not well controlled. PHQ 9: 19. Seroquel increased from 25 MG to 50 MG nightly. She is working on getting in with psychiatry. Denies intentions of suicide. Follow up in 3 months.  Call sooner if concerns arise.

## 2022-09-29 NOTE — Assessment & Plan Note (Signed)
Chronic. Ongoing. Not well controlled. Denied continuous spinal injections by [facility] d/t bulging disc found. Refills given based on recent hospital discharge medications. Continue PRN Celebrex 200 MG BID, Lyrica 50 MG BID, Zanaflex 4MG  TID, Voltaren gel QID, and Dulcolax 10MG  suppository for constipation. Educated to avoid taking Seroquel with Lyrica. New referral placed and encouraged to follow up with pain specialist. Toradol shot given in office today.

## 2022-09-29 NOTE — Assessment & Plan Note (Signed)
Labs ordered at visit today.  Will make recommendations based on lab results.   

## 2022-09-30 ENCOUNTER — Encounter: Payer: Self-pay | Admitting: Family Medicine

## 2022-12-09 ENCOUNTER — Ambulatory Visit: Payer: Self-pay | Admitting: *Deleted

## 2022-12-09 ENCOUNTER — Other Ambulatory Visit: Payer: Self-pay | Admitting: Family Medicine

## 2022-12-09 NOTE — Telephone Encounter (Signed)
Patient has a new PCP.  She will need to request her medication from them.  We will cancel her appointment with me for December.

## 2022-12-09 NOTE — Telephone Encounter (Signed)
Reason for Disposition  [1] Caller has URGENT medicine question about med that PCP or specialist prescribed AND [2] triager unable to answer question    FYI   Pt. Has established with a new PCP in [place], [place]  Called in requesting a refill on the Seroquel.   I let her know it was too soon.   Then realized she has established with another PCP.   See notes for details.  Answer Assessment - Initial Assessment Questions 1. NAME of MEDICINE: "What medicine(s) are you calling about?"     Seroquel 50 mg 2. QUESTION: "What is your question?" (e.g., double dose of medicine, side effect)     [facility] is supposed to send a request to y'all for a refill.    They told me to call y'all.   I called a couple of weeks ago they said it's not due until Dec. 3rd.  I've been out for 2 weeks.   They said it's too early but I'm out.  I don't understand why they are saying I am out.  Looking in her chart to see what the issue was I noticed she has established with another PCP in [place], [place].    Dr. Richardean Sale with [facility].   I mentioned this to the pt because pt has seen this PCP twice and is established with them.   She said she had moved to [facility] now and was trying to transfer everything there.   I let her know since she is established with Dr. Cheree Ditto now she needed to contact Dr. Cheree Ditto for this refill.  I let her know she was requesting the rx too soon.   She got quiet.   I asked if she had a question due to her getting quiet.   She said, "Ok, thank you" and ended the call.  3. PRESCRIBER: "Who prescribed the medicine?" Reason: if prescribed by specialist, call should be referred to that group.     Larae Grooms, NP  Has a new PCP now.   See above. 4. SYMPTOMS: "Do you have any symptoms?" If Yes, ask: "What symptoms are you having?"  "How bad are the symptoms (e.g., mild, moderate, severe)     I'm not feeling well 5. PREGNANCY:  "Is there any chance that you are pregnant?" "When was your last  menstrual period?"     Not asked  Protocols used: Medication Question Call-A-AH

## 2022-12-09 NOTE — Telephone Encounter (Signed)
  Chief Complaint: This pt has established with a new PCP in [place], [place]    Called in requesting a refill of the Seroquel however she is requesting too soon.   See notes for all the details.    Symptoms: "I don't feel good"    Been out for 2 weeks Frequency: Requesting rx refill too soon Pertinent Negatives: Patient denies N/A Disposition: [] ED /[] Urgent Care (no appt availability in office) / [] Appointment(In office/virtual)/ []  [facility]/ [] Home Care/ [] Refused Recommended Disposition /[] [facility]/ F Follow-up with PCP Additional Notes: FYI  for Danielle Dess this request has been sent to.

## 2022-12-10 NOTE — Telephone Encounter (Signed)
Requested medication (s) are due for refill today - no  Requested medication (s) are on the active medication list -yes  Future visit scheduled -no  Last refill: 09/26/22 #90  Notes to clinic: non delegated Rx  Requested Prescriptions  Pending Prescriptions Disp Refills   QUEtiapine (SEROQUEL) 50 MG tablet [Pharmacy Med Name: QUETIAPINE 50MG   TABLETS] 90 tablet 0    Sig: TAKE 1 TABLET(50 MG) BY MOUTH AT BEDTIME     Not Delegated - Psychiatry:  Antipsychotics - Second Generation (Atypical) - quetiapine Failed - 12/09/2022 11:05 AM      Failed - This refill cannot be delegated      Failed - TSH in normal range and within 360 days    TSH  Date Value Ref Range Status  08/17/2015 1.824 0.350 - 4.500 uIU/mL Final         Failed - Lipid Panel in normal range within the last 12 months    Cholesterol, Total  Date Value Ref Range Status  03/27/2022 176 100 - 199 mg/dL Final   LDL Chol Calc (NIH)  Date Value Ref Range Status  03/27/2022 94 0 - 99 mg/dL Final   HDL  Date Value Ref Range Status  03/27/2022 65 >39 mg/dL Final   Triglycerides  Date Value Ref Range Status  03/27/2022 92 0 - 149 mg/dL Final         Failed - CBC within normal limits and completed in the last 12 months    WBC  Date Value Ref Range Status  06/01/2022 9.2 4.0 - 10.5 K/uL Final   RBC  Date Value Ref Range Status  06/01/2022 3.39 (L) 3.87 - 5.11 MIL/uL Final   Hemoglobin  Date Value Ref Range Status  06/01/2022 10.7 (L) 12.0 - 15.0 g/dL Final   HGB  Date Value Ref Range Status  07/14/2013 13.3 12.0 - 16.0 g/dL Final   HCT  Date Value Ref Range Status  06/01/2022 31.9 (L) 36.0 - 46.0 % Final  07/14/2013 38.9 35.0 - 47.0 % Final   MCHC  Date Value Ref Range Status  06/01/2022 33.5 30.0 - 36.0 g/dL Final   [facility]  Date Value Ref Range Status  06/01/2022 31.6 26.0 - 34.0 pg Final   MCV  Date Value Ref Range Status  06/01/2022 94.1 80.0 - 100.0 fL Final  07/14/2013 94 80 - 100 fL Final    No results found for: "PLTCOUNTKUC", "LABPLAT", "POCPLA" RDW  Date Value Ref Range Status  06/01/2022 12.9 11.5 - 15.5 % Final  07/14/2013 12.9 11.5 - 14.5 % Final         Passed - Completed PHQ-2 or PHQ-9 in the last 360 days      Passed - Last BP in normal range    BP Readings from Last 1 Encounters:  09/26/22 129/82         Passed - Last Heart Rate in normal range    Pulse Readings from Last 1 Encounters:  09/26/22 80         Passed - Valid encounter within last 6 months    Recent Outpatient Visits           2 months ago Chronic pain syndrome   [place] [facility] [place], Sherran Needs, NP   5 months ago Vitamin D deficiency   [place] [facility] Larae Grooms, NP   8 months ago Welcome to [employer] preventive visit    [facility] Larae Grooms, NP  10 months ago Wheezing on auscultation   [place] [facility] Mecum, Oswaldo Conroy, PA-C   11 months ago Borderline personality disorder [facility])   [place] [facility] Larae Grooms, NP              Passed - CMP within normal limits and completed in the last 12 months    Albumin  Date Value Ref Range Status  06/01/2022 4.2 3.5 - 5.0 g/dL Final  62/13/0865 4.9 3.9 - 4.9 g/dL Final  78/46/9629 4.4 3.4 - 5.0 g/dL Final   Alkaline Phosphatase  Date Value Ref Range Status  06/01/2022 57 38 - 126 U/L Final  07/14/2013 38 (L) Unit/L Final    Comment:    45-117 NOTE: New Reference Range 12/10/12    ALT  Date Value Ref Range Status  06/01/2022 9 0 - 44 U/L Final   SGPT (ALT)  Date Value Ref Range Status  07/14/2013 14 12 - 78 U/L Final   AST  Date Value Ref Range Status  06/01/2022 19 15 - 41 U/L Final   SGOT(AST)  Date Value Ref Range Status  07/14/2013 29 15 - 37 Unit/L Final   BUN  Date Value Ref Range Status  06/01/2022 7 6 - 20 mg/dL Final  52/84/1324 6 6 - 20 mg/dL Final  40/10/2723 14 7 - 18  mg/dL Final   Calcium  Date Value Ref Range Status  06/01/2022 9.0 8.9 - 10.3 mg/dL Final   Calcium, Total  Date Value Ref Range Status  07/14/2013 8.8 8.5 - 10.1 mg/dL Final   CO2  Date Value Ref Range Status  06/01/2022 25 22 - 32 mmol/L Final   Co2  Date Value Ref Range Status  07/14/2013 26 21 - 32 mmol/L Final   Creatinine  Date Value Ref Range Status  07/14/2013 0.97 0.60 - 1.30 mg/dL Final   Creatinine, Ser  Date Value Ref Range Status  06/01/2022 0.66 0.44 - 1.00 mg/dL Final   Glucose  Date Value Ref Range Status  07/14/2013 80 65 - 99 mg/dL Final   Glucose, Bld  Date Value Ref Range Status  06/01/2022 108 (H) 70 - 99 mg/dL Final    Comment:    Glucose reference range applies only to samples taken after fasting for at least 8 hours.   Glucose-Capillary  Date Value Ref Range Status  06/30/2014 80 65 - 99 mg/dL Final   Potassium  Date Value Ref Range Status  06/01/2022 3.8 3.5 - 5.1 mmol/L Final  07/14/2013 3.9 3.5 - 5.1 mmol/L Final   Sodium  Date Value Ref Range Status  06/01/2022 136 135 - 145 mmol/L Final  03/27/2022 139 134 - 144 mmol/L Final  07/14/2013 137 136 - 145 mmol/L Final   Total Bilirubin  Date Value Ref Range Status  06/01/2022 0.6 0.3 - 1.2 mg/dL Final   Bilirubin,Total  Date Value Ref Range Status  07/14/2013 0.4 0.2 - 1.0 mg/dL Final   Bilirubin Total  Date Value Ref Range Status  03/27/2022 0.2 0.0 - 1.2 mg/dL Final   Protein, ur  Date Value Ref Range Status  07/15/2016 NEGATIVE NEGATIVE mg/dL Final   Total Protein  Date Value Ref Range Status  06/01/2022 6.9 6.5 - 8.1 g/dL Final  36/64/4034 7.2 6.0 - 8.5 g/dL Final  74/25/9563 7.3 6.4 - 8.2 g/dL Final   EGFR (African American)  Date Value Ref Range Status  07/14/2013 >60  Final   GFR calc Af Kathy Henry  Date Value Ref Range Status  12/04/2017 >60 >60 mL/min Final    Comment:    (NOTE) The eGFR has been calculated using the CKD EPI equation. This calculation has  not been validated in all clinical situations. eGFR's persistently <60 mL/min signify possible Chronic Kidney Disease.    eGFR  Date Value Ref Range Status  03/27/2022 120 >59 mL/min/1.73 Final   EGFR (Non-African Amer.)  Date Value Ref Range Status  07/14/2013 >60  Final    Comment:    eGFR values <50mL/min/1.73 m2 may be an indication of chronic kidney disease (CKD). Calculated eGFR is useful in patients with stable renal function. The eGFR calculation will not be reliable in acutely ill patients when serum creatinine is changing rapidly. It is not useful in  patients on dialysis. The eGFR calculation may not be applicable to patients at the low and high extremes of body sizes, pregnant women, and vegetarians.    GFR, Estimated  Date Value Ref Range Status  06/01/2022 >60 >60 mL/min Final    Comment:    (NOTE) Calculated using the CKD-EPI Creatinine Equation (2021)             Requested Prescriptions  Pending Prescriptions Disp Refills   QUEtiapine (SEROQUEL) 50 MG tablet [Pharmacy Med Name: QUETIAPINE 50MG   TABLETS] 90 tablet 0    Sig: TAKE 1 TABLET(50 MG) BY MOUTH AT BEDTIME     Not Delegated - Psychiatry:  Antipsychotics - Second Generation (Atypical) - quetiapine Failed - 12/09/2022 11:05 AM      Failed - This refill cannot be delegated      Failed - TSH in normal range and within 360 days    TSH  Date Value Ref Range Status  08/17/2015 1.824 0.350 - 4.500 uIU/mL Final         Failed - Lipid Panel in normal range within the last 12 months    Cholesterol, Total  Date Value Ref Range Status  03/27/2022 176 100 - 199 mg/dL Final   LDL Chol Calc (NIH)  Date Value Ref Range Status  03/27/2022 94 0 - 99 mg/dL Final   HDL  Date Value Ref Range Status  03/27/2022 65 >39 mg/dL Final   Triglycerides  Date Value Ref Range Status  03/27/2022 92 0 - 149 mg/dL Final         Failed - CBC within normal limits and completed in the last 12 months    WBC   Date Value Ref Range Status  06/01/2022 9.2 4.0 - 10.5 K/uL Final   RBC  Date Value Ref Range Status  06/01/2022 3.39 (L) 3.87 - 5.11 MIL/uL Final   Hemoglobin  Date Value Ref Range Status  06/01/2022 10.7 (L) 12.0 - 15.0 g/dL Final   HGB  Date Value Ref Range Status  07/14/2013 13.3 12.0 - 16.0 g/dL Final   HCT  Date Value Ref Range Status  06/01/2022 31.9 (L) 36.0 - 46.0 % Final  07/14/2013 38.9 35.0 - 47.0 % Final   MCHC  Date Value Ref Range Status  06/01/2022 33.5 30.0 - 36.0 g/dL Final   [facility]  Date Value Ref Range Status  06/01/2022 31.6 26.0 - 34.0 pg Final   MCV  Date Value Ref Range Status  06/01/2022 94.1 80.0 - 100.0 fL Final  07/14/2013 94 80 - 100 fL Final   No results found for: "PLTCOUNTKUC", "LABPLAT", "POCPLA" RDW  Date Value Ref Range Status  06/01/2022 12.9 11.5 - 15.5 % Final  07/14/2013  12.9 11.5 - 14.5 % Final         Passed - Completed PHQ-2 or PHQ-9 in the last 360 days      Passed - Last BP in normal range    BP Readings from Last 1 Encounters:  09/26/22 129/82         Passed - Last Heart Rate in normal range    Pulse Readings from Last 1 Encounters:  09/26/22 80         Passed - Valid encounter within last 6 months    Recent Outpatient Visits           2 months ago Chronic pain syndrome   [place] [facility] [place], Sherran Needs, NP   5 months ago Vitamin D deficiency   [place] [facility] Larae Grooms, NP   8 months ago Welcome to [employer] preventive visit   [place] [facility] Larae Grooms, NP   10 months ago Wheezing on auscultation   [place] [facility] Mecum, Oswaldo Conroy, PA-C   11 months ago Borderline personality disorder [facility])   [place] [facility] Larae Grooms, NP              Passed - CMP within normal limits and completed in the last 12 months    Albumin  Date Value Ref Range Status  06/01/2022 4.2 3.5 -  5.0 g/dL Final  95/28/4132 4.9 3.9 - 4.9 g/dL Final  44/01/270 4.4 3.4 - 5.0 g/dL Final   Alkaline Phosphatase  Date Value Ref Range Status  06/01/2022 57 38 - 126 U/L Final  07/14/2013 38 (L) Unit/L Final    Comment:    45-117 NOTE: New Reference Range 12/10/12    ALT  Date Value Ref Range Status  06/01/2022 9 0 - 44 U/L Final   SGPT (ALT)  Date Value Ref Range Status  07/14/2013 14 12 - 78 U/L Final   AST  Date Value Ref Range Status  06/01/2022 19 15 - 41 U/L Final   SGOT(AST)  Date Value Ref Range Status  07/14/2013 29 15 - 37 Unit/L Final   BUN  Date Value Ref Range Status  06/01/2022 7 6 - 20 mg/dL Final  53/66/4403 6 6 - 20 mg/dL Final  47/42/5956 14 7 - 18 mg/dL Final   Calcium  Date Value Ref Range Status  06/01/2022 9.0 8.9 - 10.3 mg/dL Final   Calcium, Total  Date Value Ref Range Status  07/14/2013 8.8 8.5 - 10.1 mg/dL Final   CO2  Date Value Ref Range Status  06/01/2022 25 22 - 32 mmol/L Final   Co2  Date Value Ref Range Status  07/14/2013 26 21 - 32 mmol/L Final   Creatinine  Date Value Ref Range Status  07/14/2013 0.97 0.60 - 1.30 mg/dL Final   Creatinine, Ser  Date Value Ref Range Status  06/01/2022 0.66 0.44 - 1.00 mg/dL Final   Glucose  Date Value Ref Range Status  07/14/2013 80 65 - 99 mg/dL Final   Glucose, Bld  Date Value Ref Range Status  06/01/2022 108 (H) 70 - 99 mg/dL Final    Comment:    Glucose reference range applies only to samples taken after fasting for at least 8 hours.   Glucose-Capillary  Date Value Ref Range Status  06/30/2014 80 65 - 99 mg/dL Final   Potassium  Date Value Ref Range Status  06/01/2022 3.8 3.5 - 5.1 mmol/L Final  07/14/2013 3.9 3.5 - 5.1 mmol/L Final   Sodium  Date Value Ref Range Status  06/01/2022 136 135 - 145 mmol/L Final  03/27/2022 139 134 - 144 mmol/L Final  07/14/2013 137 136 - 145 mmol/L Final   Total Bilirubin  Date Value Ref Range Status  06/01/2022 0.6 0.3 - 1.2 mg/dL  Final   Bilirubin,Total  Date Value Ref Range Status  07/14/2013 0.4 0.2 - 1.0 mg/dL Final   Bilirubin Total  Date Value Ref Range Status  03/27/2022 0.2 0.0 - 1.2 mg/dL Final   Protein, ur  Date Value Ref Range Status  07/15/2016 NEGATIVE NEGATIVE mg/dL Final   Total Protein  Date Value Ref Range Status  06/01/2022 6.9 6.5 - 8.1 g/dL Final  45/40/9811 7.2 6.0 - 8.5 g/dL Final  91/47/8295 7.3 6.4 - 8.2 g/dL Final   EGFR (African American)  Date Value Ref Range Status  07/14/2013 >60  Final   GFR calc Af Amer  Date Value Ref Range Status  12/04/2017 >60 >60 mL/min Final    Comment:    (NOTE) The eGFR has been calculated using the CKD EPI equation. This calculation has not been validated in all clinical situations. eGFR's persistently <60 mL/min signify possible Chronic Kidney Disease.    eGFR  Date Value Ref Range Status  03/27/2022 120 >59 mL/min/1.73 Final   EGFR (Non-African Amer.)  Date Value Ref Range Status  07/14/2013 >60  Final    Comment:    eGFR values <98mL/min/1.73 m2 may be an indication of chronic kidney disease (CKD). Calculated eGFR is useful in patients with stable renal function. The eGFR calculation will not be reliable in acutely ill patients when serum creatinine is changing rapidly. It is not useful in  patients on dialysis. The eGFR calculation may not be applicable to patients at the low and high extremes of body sizes, pregnant women, and vegetarians.    GFR, Estimated  Date Value Ref Range Status  06/01/2022 >60 >60 mL/min Final    Comment:    (NOTE) Calculated using the CKD-EPI Creatinine Equation (2021)

## 2022-12-30 ENCOUNTER — Ambulatory Visit: Payer: Medicare Other | Admitting: Nurse Practitioner

## 2023-02-02 ENCOUNTER — Other Ambulatory Visit: Payer: Self-pay | Admitting: Family Medicine

## 2023-02-03 NOTE — Telephone Encounter (Signed)
 Requested medication (s) are due for refill today: Yes  Requested medication (s) are on the active medication list: Yes Pregabalin  is , No Celecoxib  is not.  Last refill:  09/26/22  Future visit scheduled: No  Notes to clinic:  Unable to refill per protocol, cannot delegate. Unable to refill per protocol, Rx expired.      Requested Prescriptions  Pending Prescriptions Disp Refills   celecoxib  (CELEBREX ) 200 MG capsule [Pharmacy Med Name: CELECOXIB  200MG  CAPSULES] 60 capsule 2    Sig: TAKE 1 CAPSULE(200 MG) BY MOUTH TWICE DAILY AS NEEDED     Analgesics:  COX2 Inhibitors Failed - 02/03/2023  3:52 PM      Failed - Manual Review: Labs are only required if the patient has taken medication for more than 8 weeks.      Failed - HGB in normal range and within 360 days    Hemoglobin  Date Value Ref Range Status  06/01/2022 10.7 (L) 12.0 - 15.0 g/dL Final   HGB  Date Value Ref Range Status  07/14/2013 13.3 12.0 - 16.0 g/dL Final         Failed - HCT in normal range and within 360 days    HCT  Date Value Ref Range Status  06/01/2022 31.9 (L) 36.0 - 46.0 % Final  07/14/2013 38.9 35.0 - 47.0 % Final         Passed - Cr in normal range and within 360 days    Creatinine  Date Value Ref Range Status  07/14/2013 0.97 0.60 - 1.30 mg/dL Final   Creatinine, Ser  Date Value Ref Range Status  06/01/2022 0.66 0.44 - 1.00 mg/dL Final         Passed - AST in normal range and within 360 days    AST  Date Value Ref Range Status  06/01/2022 19 15 - 41 U/L Final   SGOT(AST)  Date Value Ref Range Status  07/14/2013 29 15 - 37 Unit/L Final         Passed - ALT in normal range and within 360 days    ALT  Date Value Ref Range Status  06/01/2022 9 0 - 44 U/L Final   SGPT (ALT)  Date Value Ref Range Status  07/14/2013 14 12 - 78 U/L Final         Passed - eGFR is 30 or above and within 360 days    EGFR (African American)  Date Value Ref Range Status  07/14/2013 >60  Final   GFR calc  Af Amer  Date Value Ref Range Status  12/04/2017 >60 >60 mL/min Final    Comment:    (NOTE) The eGFR has been calculated using the CKD EPI equation. This calculation has not been validated in all clinical situations. eGFR's persistently <60 mL/min signify possible Chronic Kidney Disease.    EGFR (Non-African Amer.)  Date Value Ref Range Status  07/14/2013 >60  Final    Comment:    eGFR values <62mL/min/1.73 m2 may be an indication of chronic kidney disease (CKD). Calculated eGFR is useful in patients with stable renal function. The eGFR calculation will not be reliable in acutely ill patients when serum creatinine is changing rapidly. It is not useful in  patients on dialysis. The eGFR calculation may not be applicable to patients at the low and high extremes of body sizes, pregnant women, and vegetarians.    GFR, Estimated  Date Value Ref Range Status  06/01/2022 >60 >60 mL/min Final  Comment:    (NOTE) Calculated using the CKD-EPI Creatinine Equation (2021)    eGFR  Date Value Ref Range Status  03/27/2022 120 >59 mL/min/1.73 Final         Passed - Patient is not pregnant      Passed - Valid encounter within last 12 months    Recent Outpatient Visits           4 months ago Chronic pain syndrome   [place] [facility], Hyla Givens, NP   7 months ago Vitamin D  deficiency   [place] [id number] [facility] Melvin Pao, NP   10 months ago Welcome to [employer] preventive visit   [place] [facility] Melvin Pao, NP   1 year ago Wheezing on auscultation   [place] [facility] Mecum, Rocky BRAVO, PA-C   1 year ago Borderline personality disorder [facility])   [place] [facility] Melvin Pao, NP               pregabalin  (LYRICA ) 50 MG capsule [Pharmacy Med Name: PREGABALIN  50MG  CAPSULES] 60 capsule     Sig: TAKE 1 CAPSULE(50 MG) BY MOUTH TWICE DAILY     Not Delegated -  Neurology:  Anticonvulsants - Controlled - pregabalin  Failed - 02/03/2023  3:52 PM      Failed - This refill cannot be delegated      Passed - Cr in normal range and within 360 days    Creatinine  Date Value Ref Range Status  07/14/2013 0.97 0.60 - 1.30 mg/dL Final   Creatinine, Ser  Date Value Ref Range Status  06/01/2022 0.66 0.44 - 1.00 mg/dL Final         Passed - Completed PHQ-2 or PHQ-9 in the last 360 days      Passed - Valid encounter within last 12 months    Recent Outpatient Visits           4 months ago Chronic pain syndrome   [place] [facility] [place], Hyla Givens, NP   7 months ago Vitamin D  deficiency   [place] [facility] Melvin Pao, NP   10 months ago Welcome to [employer] preventive visit   [place] [facility] Melvin Pao, NP   1 year ago Wheezing on auscultation   [place] [facility] Mecum, Rocky BRAVO, PA-C   1 year ago Borderline personality disorder [facility])   [place] [facility] Melvin Pao, NP

## 2023-05-20 ENCOUNTER — Other Ambulatory Visit: Payer: Self-pay | Admitting: Nurse Practitioner

## 2023-05-20 DIAGNOSIS — M546 Pain in thoracic spine: Secondary | ICD-10-CM

## 2023-05-23 NOTE — Telephone Encounter (Signed)
 Discontinued on 09/26/22, change in therapy.  Requested Prescriptions  Pending Prescriptions Disp Refills   gabapentin  (NEURONTIN ) 400 MG capsule [Pharmacy Med Name: GABAPENTIN  400MG  CAPSULES] 270 capsule 1    Sig: TAKE 1 CAPSULE(400 MG) BY MOUTH THREE TIMES DAILY     Neurology: Anticonvulsants - gabapentin  Failed - 05/23/2023 11:16 AM      Failed - Valid encounter within last 12 months    Recent Outpatient Visits   None            Passed - Cr in normal range and within 360 days    Creatinine  Date Value Ref Range Status  07/14/2013 0.97 0.60 - 1.30 mg/dL Final   Creatinine, Ser  Date Value Ref Range Status  06/01/2022 0.66 0.44 - 1.00 mg/dL Final         Passed - Completed PHQ-2 or PHQ-9 in the last 360 days
